# Patient Record
Sex: Male | Born: 1935 | Race: White | Hispanic: No | Marital: Married | Smoking: Never smoker
Health system: Southern US, Community
[De-identification: ages and names within clinical notes are randomized; demographics above are authoritative.]

## PROBLEM LIST (undated history)

## (undated) DIAGNOSIS — R410 Disorientation, unspecified: Secondary | ICD-10-CM

## (undated) DIAGNOSIS — I2699 Other pulmonary embolism without acute cor pulmonale: Secondary | ICD-10-CM

## (undated) DIAGNOSIS — Z923 Personal history of irradiation: Secondary | ICD-10-CM

## (undated) DIAGNOSIS — J90 Pleural effusion, not elsewhere classified: Secondary | ICD-10-CM

## (undated) DIAGNOSIS — I639 Cerebral infarction, unspecified: Secondary | ICD-10-CM

## (undated) DIAGNOSIS — F32A Depression, unspecified: Secondary | ICD-10-CM

## (undated) DIAGNOSIS — H3530 Unspecified macular degeneration: Secondary | ICD-10-CM

## (undated) DIAGNOSIS — F329 Major depressive disorder, single episode, unspecified: Secondary | ICD-10-CM

## (undated) DIAGNOSIS — C801 Malignant (primary) neoplasm, unspecified: Secondary | ICD-10-CM

## (undated) DIAGNOSIS — C3490 Malignant neoplasm of unspecified part of unspecified bronchus or lung: Secondary | ICD-10-CM

## (undated) DIAGNOSIS — M1990 Unspecified osteoarthritis, unspecified site: Secondary | ICD-10-CM

## (undated) DIAGNOSIS — J189 Pneumonia, unspecified organism: Secondary | ICD-10-CM

## (undated) HISTORY — DX: Unspecified osteoarthritis, unspecified site: M19.90

## (undated) HISTORY — DX: Personal history of irradiation: Z92.3

## (undated) HISTORY — PX: OTHER SURGICAL HISTORY: SHX169

## (undated) HISTORY — DX: Depression, unspecified: F32.A

## (undated) HISTORY — PX: HIP SURGERY: SHX245

## (undated) HISTORY — DX: Major depressive disorder, single episode, unspecified: F32.9

## (undated) HISTORY — PX: JOINT REPLACEMENT: SHX530

## (undated) HISTORY — PX: KNEE SURGERY: SHX244

---

## 1988-11-07 DIAGNOSIS — I639 Cerebral infarction, unspecified: Secondary | ICD-10-CM

## 1988-11-07 HISTORY — DX: Cerebral infarction, unspecified: I63.9

## 1988-11-07 HISTORY — PX: REFRACTIVE SURGERY: SHX103

## 1999-09-04 ENCOUNTER — Ambulatory Visit (HOSPITAL_COMMUNITY): Admission: RE | Admit: 1999-09-04 | Discharge: 1999-09-04 | Payer: Self-pay | Admitting: Neurology

## 1999-09-04 ENCOUNTER — Encounter: Payer: Self-pay | Admitting: Neurology

## 2001-11-22 ENCOUNTER — Ambulatory Visit (HOSPITAL_COMMUNITY): Admission: RE | Admit: 2001-11-22 | Discharge: 2001-11-22 | Payer: Self-pay | Admitting: *Deleted

## 2002-04-14 ENCOUNTER — Encounter: Payer: Self-pay | Admitting: Internal Medicine

## 2002-04-14 ENCOUNTER — Inpatient Hospital Stay (HOSPITAL_COMMUNITY): Admission: AD | Admit: 2002-04-14 | Discharge: 2002-04-22 | Payer: Self-pay | Admitting: Internal Medicine

## 2002-04-16 ENCOUNTER — Encounter: Payer: Self-pay | Admitting: Internal Medicine

## 2002-10-20 ENCOUNTER — Encounter: Admission: RE | Admit: 2002-10-20 | Discharge: 2002-10-20 | Payer: Self-pay | Admitting: *Deleted

## 2002-10-20 ENCOUNTER — Encounter: Payer: Self-pay | Admitting: *Deleted

## 2003-04-04 DIAGNOSIS — K5730 Diverticulosis of large intestine without perforation or abscess without bleeding: Secondary | ICD-10-CM | POA: Insufficient documentation

## 2004-03-09 DIAGNOSIS — C3490 Malignant neoplasm of unspecified part of unspecified bronchus or lung: Secondary | ICD-10-CM

## 2004-03-09 HISTORY — DX: Malignant neoplasm of unspecified part of unspecified bronchus or lung: C34.90

## 2004-05-20 ENCOUNTER — Ambulatory Visit: Admission: RE | Admit: 2004-05-20 | Discharge: 2004-05-20 | Payer: Self-pay | Admitting: Orthopedic Surgery

## 2004-05-22 ENCOUNTER — Ambulatory Visit (HOSPITAL_COMMUNITY): Admission: RE | Admit: 2004-05-22 | Discharge: 2004-05-22 | Payer: Self-pay | Admitting: Orthopedic Surgery

## 2004-06-05 ENCOUNTER — Ambulatory Visit (HOSPITAL_COMMUNITY): Admission: RE | Admit: 2004-06-05 | Discharge: 2004-06-05 | Payer: Self-pay | Admitting: Thoracic Surgery

## 2004-06-06 ENCOUNTER — Ambulatory Visit (HOSPITAL_COMMUNITY): Admission: RE | Admit: 2004-06-06 | Discharge: 2004-06-06 | Payer: Self-pay | Admitting: Thoracic Surgery

## 2004-06-10 ENCOUNTER — Ambulatory Visit (HOSPITAL_COMMUNITY): Admission: RE | Admit: 2004-06-10 | Discharge: 2004-06-10 | Payer: Self-pay | Admitting: Thoracic Surgery

## 2004-06-10 ENCOUNTER — Encounter (INDEPENDENT_AMBULATORY_CARE_PROVIDER_SITE_OTHER): Payer: Self-pay | Admitting: *Deleted

## 2004-06-17 ENCOUNTER — Ambulatory Visit (HOSPITAL_COMMUNITY): Admission: RE | Admit: 2004-06-17 | Discharge: 2004-06-17 | Payer: Self-pay | Admitting: Thoracic Surgery

## 2004-06-17 ENCOUNTER — Encounter (INDEPENDENT_AMBULATORY_CARE_PROVIDER_SITE_OTHER): Payer: Self-pay | Admitting: Specialist

## 2004-06-19 ENCOUNTER — Ambulatory Visit: Payer: Self-pay | Admitting: Internal Medicine

## 2004-06-27 ENCOUNTER — Ambulatory Visit: Admission: RE | Admit: 2004-06-27 | Discharge: 2004-07-31 | Payer: Self-pay | Admitting: *Deleted

## 2004-07-10 ENCOUNTER — Emergency Department (HOSPITAL_COMMUNITY): Admission: EM | Admit: 2004-07-10 | Discharge: 2004-07-10 | Payer: Self-pay | Admitting: Emergency Medicine

## 2004-07-13 ENCOUNTER — Inpatient Hospital Stay (HOSPITAL_COMMUNITY): Admission: EM | Admit: 2004-07-13 | Discharge: 2004-07-15 | Payer: Self-pay | Admitting: Emergency Medicine

## 2004-07-13 ENCOUNTER — Ambulatory Visit: Payer: Self-pay | Admitting: Internal Medicine

## 2004-07-14 ENCOUNTER — Ambulatory Visit: Payer: Self-pay | Admitting: Internal Medicine

## 2004-07-14 ENCOUNTER — Encounter: Payer: Self-pay | Admitting: Gastroenterology

## 2004-08-13 ENCOUNTER — Ambulatory Visit: Payer: Self-pay | Admitting: Internal Medicine

## 2004-08-26 ENCOUNTER — Ambulatory Visit: Admission: RE | Admit: 2004-08-26 | Discharge: 2004-08-26 | Payer: Self-pay | Admitting: *Deleted

## 2004-09-12 ENCOUNTER — Ambulatory Visit (HOSPITAL_COMMUNITY): Admission: RE | Admit: 2004-09-12 | Discharge: 2004-09-12 | Payer: Self-pay | Admitting: Internal Medicine

## 2004-09-30 ENCOUNTER — Ambulatory Visit: Payer: Self-pay | Admitting: Internal Medicine

## 2004-11-14 ENCOUNTER — Ambulatory Visit (HOSPITAL_COMMUNITY): Admission: RE | Admit: 2004-11-14 | Discharge: 2004-11-14 | Payer: Self-pay | Admitting: Internal Medicine

## 2004-11-17 ENCOUNTER — Ambulatory Visit: Payer: Self-pay | Admitting: Internal Medicine

## 2005-01-12 ENCOUNTER — Ambulatory Visit: Payer: Self-pay | Admitting: Internal Medicine

## 2005-01-15 ENCOUNTER — Ambulatory Visit (HOSPITAL_COMMUNITY): Admission: RE | Admit: 2005-01-15 | Discharge: 2005-01-15 | Payer: Self-pay | Admitting: Internal Medicine

## 2005-02-17 ENCOUNTER — Encounter: Admission: RE | Admit: 2005-02-17 | Discharge: 2005-02-17 | Payer: Self-pay | Admitting: Orthopedic Surgery

## 2005-04-14 ENCOUNTER — Ambulatory Visit: Payer: Self-pay | Admitting: Internal Medicine

## 2005-04-16 ENCOUNTER — Ambulatory Visit (HOSPITAL_COMMUNITY): Admission: RE | Admit: 2005-04-16 | Discharge: 2005-04-16 | Payer: Self-pay | Admitting: Internal Medicine

## 2005-06-11 ENCOUNTER — Ambulatory Visit (HOSPITAL_COMMUNITY): Admission: RE | Admit: 2005-06-11 | Discharge: 2005-06-11 | Payer: Self-pay | Admitting: Internal Medicine

## 2005-06-16 ENCOUNTER — Ambulatory Visit: Payer: Self-pay | Admitting: Internal Medicine

## 2005-06-16 LAB — CBC WITH DIFFERENTIAL/PLATELET
Basophils Absolute: 0 10*3/uL (ref 0.0–0.1)
Eosinophils Absolute: 0.1 10*3/uL (ref 0.0–0.5)
HGB: 11.9 g/dL — ABNORMAL LOW (ref 13.0–17.1)
LYMPH%: 27 % (ref 14.0–48.0)
MCV: 98.6 fL — ABNORMAL HIGH (ref 81.6–98.0)
MONO%: 11.8 % (ref 0.0–13.0)
NEUT#: 2.5 10*3/uL (ref 1.5–6.5)
NEUT%: 58.5 % (ref 40.0–75.0)
Platelets: 218 10*3/uL (ref 145–400)

## 2005-06-16 LAB — COMPREHENSIVE METABOLIC PANEL
ALT: 9 U/L (ref 0–40)
AST: 19 U/L (ref 0–37)
Albumin: 3.7 g/dL (ref 3.5–5.2)
Alkaline Phosphatase: 69 U/L (ref 39–117)
BUN: 15 mg/dL (ref 6–23)
Creatinine, Ser: 1 mg/dL (ref 0.4–1.5)
Potassium: 4 mEq/L (ref 3.5–5.3)

## 2005-07-22 LAB — CBC WITH DIFFERENTIAL/PLATELET
Basophils Absolute: 0 10*3/uL (ref 0.0–0.1)
EOS%: 1.9 % (ref 0.0–7.0)
Eosinophils Absolute: 0.1 10*3/uL (ref 0.0–0.5)
LYMPH%: 37.7 % (ref 14.0–48.0)
MCH: 33.7 pg — ABNORMAL HIGH (ref 28.0–33.4)
MCV: 98.9 fL — ABNORMAL HIGH (ref 81.6–98.0)
MONO%: 14 % — ABNORMAL HIGH (ref 0.0–13.0)
NEUT#: 1.3 10*3/uL — ABNORMAL LOW (ref 1.5–6.5)
Platelets: 194 10*3/uL (ref 145–400)
RBC: 3.58 10*6/uL — ABNORMAL LOW (ref 4.20–5.71)

## 2005-07-22 LAB — COMPREHENSIVE METABOLIC PANEL
Alkaline Phosphatase: 70 U/L (ref 39–117)
BUN: 15 mg/dL (ref 6–23)
Glucose, Bld: 98 mg/dL (ref 70–99)
Sodium: 141 mEq/L (ref 135–145)
Total Bilirubin: 0.8 mg/dL (ref 0.3–1.2)

## 2005-07-23 ENCOUNTER — Encounter: Admission: RE | Admit: 2005-07-23 | Discharge: 2005-07-23 | Payer: Self-pay | Admitting: Orthopedic Surgery

## 2005-08-17 ENCOUNTER — Ambulatory Visit: Payer: Self-pay | Admitting: Internal Medicine

## 2005-08-19 LAB — COMPREHENSIVE METABOLIC PANEL
AST: 21 U/L (ref 0–37)
Alkaline Phosphatase: 67 U/L (ref 39–117)
Glucose, Bld: 81 mg/dL (ref 70–99)
Sodium: 141 mEq/L (ref 135–145)
Total Bilirubin: 0.8 mg/dL (ref 0.3–1.2)
Total Protein: 6.8 g/dL (ref 6.0–8.3)

## 2005-08-19 LAB — CBC WITH DIFFERENTIAL/PLATELET
BASO%: 0.4 % (ref 0.0–2.0)
EOS%: 2.4 % (ref 0.0–7.0)
Eosinophils Absolute: 0.1 10*3/uL (ref 0.0–0.5)
LYMPH%: 36.5 % (ref 14.0–48.0)
MCH: 34.5 pg — ABNORMAL HIGH (ref 28.0–33.4)
MCHC: 34.3 g/dL (ref 32.0–35.9)
MCV: 100.4 fL — ABNORMAL HIGH (ref 81.6–98.0)
MONO%: 12.9 % (ref 0.0–13.0)
Platelets: 199 10*3/uL (ref 145–400)
RBC: 3.59 10*6/uL — ABNORMAL LOW (ref 4.20–5.71)
RDW: 14.9 % — ABNORMAL HIGH (ref 11.2–14.6)

## 2005-09-15 ENCOUNTER — Ambulatory Visit (HOSPITAL_COMMUNITY): Admission: RE | Admit: 2005-09-15 | Discharge: 2005-09-15 | Payer: Self-pay | Admitting: Internal Medicine

## 2005-09-18 LAB — CBC WITH DIFFERENTIAL/PLATELET
BASO%: 0.6 % (ref 0.0–2.0)
EOS%: 4.8 % (ref 0.0–7.0)
HCT: 32.7 % — ABNORMAL LOW (ref 38.7–49.9)
LYMPH%: 33.6 % (ref 14.0–48.0)
MCH: 34.5 pg — ABNORMAL HIGH (ref 28.0–33.4)
MCHC: 34 g/dL (ref 32.0–35.9)
MONO#: 0.4 10*3/uL (ref 0.1–0.9)
NEUT%: 47.3 % (ref 40.0–75.0)
Platelets: 190 10*3/uL (ref 145–400)

## 2005-09-18 LAB — COMPREHENSIVE METABOLIC PANEL
ALT: <8 U/L (ref 0–40)
Albumin: 3.7 g/dL (ref 3.5–5.2)
Alkaline Phosphatase: 77 U/L (ref 39–117)
Chloride: 108 mEq/L (ref 96–112)
Total Bilirubin: 0.6 mg/dL (ref 0.3–1.2)
Total Protein: 6.1 g/dL (ref 6.0–8.3)

## 2005-10-13 ENCOUNTER — Ambulatory Visit: Payer: Self-pay | Admitting: Internal Medicine

## 2005-10-15 LAB — COMPREHENSIVE METABOLIC PANEL
ALT: 11 U/L (ref 0–40)
AST: 22 U/L (ref 0–37)
Creatinine, Ser: 0.99 mg/dL (ref 0.40–1.50)
Total Bilirubin: 0.8 mg/dL (ref 0.3–1.2)

## 2005-10-15 LAB — CBC WITH DIFFERENTIAL/PLATELET
BASO%: 0.6 % (ref 0.0–2.0)
EOS%: 3.2 % (ref 0.0–7.0)
HCT: 34.1 % — ABNORMAL LOW (ref 38.7–49.9)
LYMPH%: 31.4 % (ref 14.0–48.0)
MCH: 34.5 pg — ABNORMAL HIGH (ref 28.0–33.4)
MCHC: 34 g/dL (ref 32.0–35.9)
NEUT%: 53.9 % (ref 40.0–75.0)
Platelets: 199 10*3/uL (ref 145–400)

## 2005-10-16 ENCOUNTER — Ambulatory Visit: Payer: Self-pay | Admitting: Physical Medicine & Rehabilitation

## 2005-10-19 ENCOUNTER — Inpatient Hospital Stay (HOSPITAL_COMMUNITY): Admission: RE | Admit: 2005-10-19 | Discharge: 2005-10-23 | Payer: Self-pay | Admitting: Orthopedic Surgery

## 2005-10-23 ENCOUNTER — Inpatient Hospital Stay (HOSPITAL_COMMUNITY)
Admission: RE | Admit: 2005-10-23 | Discharge: 2005-11-05 | Payer: Self-pay | Admitting: Physical Medicine & Rehabilitation

## 2005-11-20 ENCOUNTER — Encounter: Admission: RE | Admit: 2005-11-20 | Discharge: 2005-11-20 | Payer: Self-pay | Admitting: Orthopedic Surgery

## 2005-11-23 ENCOUNTER — Encounter: Admission: RE | Admit: 2005-11-23 | Discharge: 2005-12-29 | Payer: Self-pay | Admitting: Orthopedic Surgery

## 2005-12-07 ENCOUNTER — Ambulatory Visit: Payer: Self-pay | Admitting: Internal Medicine

## 2005-12-10 ENCOUNTER — Ambulatory Visit (HOSPITAL_COMMUNITY): Admission: RE | Admit: 2005-12-10 | Discharge: 2005-12-10 | Payer: Self-pay | Admitting: Internal Medicine

## 2005-12-10 ENCOUNTER — Encounter: Payer: Self-pay | Admitting: Vascular Surgery

## 2005-12-18 ENCOUNTER — Inpatient Hospital Stay (HOSPITAL_COMMUNITY): Admission: EM | Admit: 2005-12-18 | Discharge: 2005-12-21 | Payer: Self-pay | Admitting: *Deleted

## 2005-12-30 ENCOUNTER — Encounter: Admission: RE | Admit: 2005-12-30 | Discharge: 2006-01-21 | Payer: Self-pay | Admitting: Orthopedic Surgery

## 2006-01-11 ENCOUNTER — Ambulatory Visit (HOSPITAL_COMMUNITY): Admission: RE | Admit: 2006-01-11 | Discharge: 2006-01-11 | Payer: Self-pay | Admitting: Internal Medicine

## 2006-01-11 LAB — CBC WITH DIFFERENTIAL/PLATELET
BASO%: 0.4 % (ref 0.0–2.0)
Basophils Absolute: 0 10*3/uL (ref 0.0–0.1)
EOS%: 2.4 % (ref 0.0–7.0)
Eosinophils Absolute: 0.1 10*3/uL (ref 0.0–0.5)
HCT: 35.3 % — ABNORMAL LOW (ref 38.7–49.9)
HGB: 11.8 g/dL — ABNORMAL LOW (ref 13.0–17.1)
LYMPH%: 31.2 % (ref 14.0–48.0)
MCH: 33.2 pg (ref 28.0–33.4)
MCHC: 33.5 g/dL (ref 32.0–35.9)
MCV: 99.2 fL — ABNORMAL HIGH (ref 81.6–98.0)
MONO#: 0.4 10*3/uL (ref 0.1–0.9)
MONO%: 9.1 % (ref 0.0–13.0)
NEUT#: 2.6 10*3/uL (ref 1.5–6.5)
NEUT%: 56.9 % (ref 40.0–75.0)
Platelets: 264 10*3/uL (ref 145–400)
RBC: 3.56 10*6/uL — ABNORMAL LOW (ref 4.20–5.71)
RDW: 14.4 % (ref 11.2–14.6)
WBC: 4.5 10*3/uL (ref 4.0–10.0)
lymph#: 1.4 10*3/uL (ref 0.9–3.3)

## 2006-01-11 LAB — COMPREHENSIVE METABOLIC PANEL
ALT: <8 U/L (ref 0–53)
AST: 21 U/L (ref 0–37)
Albumin: 3.6 g/dL (ref 3.5–5.2)
Alkaline Phosphatase: 65 U/L (ref 39–117)
BUN: 13 mg/dL (ref 6–23)
CO2: 26 mEq/L (ref 19–32)
Calcium: 8.6 mg/dL (ref 8.4–10.5)
Chloride: 103 mEq/L (ref 96–112)
Creatinine, Ser: 0.87 mg/dL (ref 0.40–1.50)
Glucose, Bld: 109 mg/dL — ABNORMAL HIGH (ref 70–99)
Potassium: 4.2 mEq/L (ref 3.5–5.3)
Sodium: 139 mEq/L (ref 135–145)
Total Bilirubin: 0.6 mg/dL (ref 0.3–1.2)
Total Protein: 6.5 g/dL (ref 6.0–8.3)

## 2006-01-29 ENCOUNTER — Inpatient Hospital Stay (HOSPITAL_COMMUNITY): Admission: RE | Admit: 2006-01-29 | Discharge: 2006-02-02 | Payer: Self-pay | Admitting: Orthopedic Surgery

## 2006-02-02 ENCOUNTER — Ambulatory Visit: Payer: Self-pay | Admitting: Physical Medicine & Rehabilitation

## 2006-02-02 ENCOUNTER — Inpatient Hospital Stay (HOSPITAL_COMMUNITY)
Admission: RE | Admit: 2006-02-02 | Discharge: 2006-02-09 | Payer: Self-pay | Admitting: Physical Medicine & Rehabilitation

## 2006-02-11 ENCOUNTER — Ambulatory Visit: Payer: Self-pay | Admitting: Internal Medicine

## 2006-02-15 LAB — CBC WITH DIFFERENTIAL/PLATELET
BASO%: 0.5 % (ref 0.0–2.0)
LYMPH%: 27.9 % (ref 14.0–48.0)
MCHC: 33 g/dL (ref 32.0–35.9)
MCV: 98 fL (ref 81.6–98.0)
MONO%: 11.8 % (ref 0.0–13.0)
Platelets: 375 10*3/uL (ref 145–400)
RBC: 3.02 10*6/uL — ABNORMAL LOW (ref 4.20–5.71)
WBC: 4.1 10*3/uL (ref 4.0–10.0)

## 2006-02-15 LAB — COMPREHENSIVE METABOLIC PANEL
ALT: 10 U/L (ref 0–53)
AST: 22 U/L (ref 0–37)
Alkaline Phosphatase: 74 U/L (ref 39–117)
Sodium: 142 mEq/L (ref 135–145)
Total Bilirubin: 0.4 mg/dL (ref 0.3–1.2)
Total Protein: 6.2 g/dL (ref 6.0–8.3)

## 2006-03-23 ENCOUNTER — Ambulatory Visit: Payer: Self-pay | Admitting: Internal Medicine

## 2006-03-23 LAB — CBC WITH DIFFERENTIAL/PLATELET
Basophils Absolute: 0 10*3/uL (ref 0.0–0.1)
EOS%: 4.9 % (ref 0.0–7.0)
HCT: 34 % — ABNORMAL LOW (ref 38.7–49.9)
HGB: 11.3 g/dL — ABNORMAL LOW (ref 13.0–17.1)
MCH: 32.8 pg (ref 28.0–33.4)
MCV: 98.6 fL — ABNORMAL HIGH (ref 81.6–98.0)
MONO%: 11.1 % (ref 0.0–13.0)
NEUT%: 43.6 % (ref 40.0–75.0)

## 2006-03-23 LAB — COMPREHENSIVE METABOLIC PANEL
AST: 21 U/L (ref 0–37)
Alkaline Phosphatase: 86 U/L (ref 39–117)
BUN: 22 mg/dL (ref 6–23)
Creatinine, Ser: 1.02 mg/dL (ref 0.40–1.50)
Total Bilirubin: 0.5 mg/dL (ref 0.3–1.2)

## 2006-04-16 ENCOUNTER — Ambulatory Visit (HOSPITAL_COMMUNITY): Admission: RE | Admit: 2006-04-16 | Discharge: 2006-04-16 | Payer: Self-pay | Admitting: Internal Medicine

## 2006-04-20 LAB — CBC WITH DIFFERENTIAL/PLATELET
Basophils Absolute: 0 10*3/uL (ref 0.0–0.1)
EOS%: 3.8 % (ref 0.0–7.0)
HCT: 33.6 % — ABNORMAL LOW (ref 38.7–49.9)
HGB: 11.7 g/dL — ABNORMAL LOW (ref 13.0–17.1)
MCH: 33.3 pg (ref 28.0–33.4)
MCV: 95.9 fL (ref 81.6–98.0)
NEUT%: 46.5 % (ref 40.0–75.0)
lymph#: 1.3 10*3/uL (ref 0.9–3.3)

## 2006-04-20 LAB — COMPREHENSIVE METABOLIC PANEL
AST: 23 U/L (ref 0–37)
BUN: 19 mg/dL (ref 6–23)
Calcium: 9.2 mg/dL (ref 8.4–10.5)
Chloride: 100 mEq/L (ref 96–112)
Creatinine, Ser: 1.18 mg/dL (ref 0.40–1.50)

## 2006-05-13 ENCOUNTER — Ambulatory Visit: Payer: Self-pay | Admitting: Internal Medicine

## 2006-05-18 LAB — COMPREHENSIVE METABOLIC PANEL
ALT: <8 U/L (ref 0–53)
AST: 27 U/L (ref 0–37)
Albumin: 3.9 g/dL (ref 3.5–5.2)
Alkaline Phosphatase: 90 U/L (ref 39–117)
BUN: 22 mg/dL (ref 6–23)
CO2: 32 mEq/L (ref 19–32)
Calcium: 9.2 mg/dL (ref 8.4–10.5)
Chloride: 100 mEq/L (ref 96–112)
Creatinine, Ser: 1.13 mg/dL (ref 0.40–1.50)
Glucose, Bld: 82 mg/dL (ref 70–99)
Potassium: 3.7 mEq/L (ref 3.5–5.3)
Sodium: 140 mEq/L (ref 135–145)
Total Bilirubin: 0.8 mg/dL (ref 0.3–1.2)
Total Protein: 7.5 g/dL (ref 6.0–8.3)

## 2006-05-18 LAB — CBC WITH DIFFERENTIAL/PLATELET
Basophils Absolute: 0 10*3/uL (ref 0.0–0.1)
EOS%: 2.5 % (ref 0.0–7.0)
HGB: 12.1 g/dL — ABNORMAL LOW (ref 13.0–17.1)
LYMPH%: 28.7 % (ref 14.0–48.0)
MCH: 32.3 pg (ref 28.0–33.4)
MCV: 95.3 fL (ref 81.6–98.0)
MONO%: 14.3 % — ABNORMAL HIGH (ref 0.0–13.0)
RBC: 3.76 10*6/uL — ABNORMAL LOW (ref 4.20–5.71)
RDW: 14.8 % — ABNORMAL HIGH (ref 11.2–14.6)

## 2006-06-15 LAB — COMPREHENSIVE METABOLIC PANEL
AST: 24 U/L (ref 0–37)
Alkaline Phosphatase: 93 U/L (ref 39–117)
BUN: 24 mg/dL — ABNORMAL HIGH (ref 6–23)
Creatinine, Ser: 1.09 mg/dL (ref 0.40–1.50)
Glucose, Bld: 89 mg/dL (ref 70–99)

## 2006-06-15 LAB — CBC WITH DIFFERENTIAL/PLATELET
Basophils Absolute: 0 10*3/uL (ref 0.0–0.1)
EOS%: 2.2 % (ref 0.0–7.0)
Eosinophils Absolute: 0.1 10*3/uL (ref 0.0–0.5)
HCT: 35.1 % — ABNORMAL LOW (ref 38.7–49.9)
HGB: 12.4 g/dL — ABNORMAL LOW (ref 13.0–17.1)
LYMPH%: 34 % (ref 14.0–48.0)
MCH: 33.7 pg — ABNORMAL HIGH (ref 28.0–33.4)
MCV: 95.4 fL (ref 81.6–98.0)
MONO%: 12.6 % (ref 0.0–13.0)
NEUT%: 50.3 % (ref 40.0–75.0)
Platelets: 217 10*3/uL (ref 145–400)

## 2006-07-09 ENCOUNTER — Ambulatory Visit (HOSPITAL_COMMUNITY): Admission: RE | Admit: 2006-07-09 | Discharge: 2006-07-09 | Payer: Self-pay | Admitting: Internal Medicine

## 2006-07-09 ENCOUNTER — Ambulatory Visit: Payer: Self-pay | Admitting: Internal Medicine

## 2006-07-13 LAB — COMPREHENSIVE METABOLIC PANEL
AST: 29 U/L (ref 0–37)
Albumin: 4.2 g/dL (ref 3.5–5.2)
BUN: 23 mg/dL (ref 6–23)
Calcium: 9.1 mg/dL (ref 8.4–10.5)
Chloride: 100 mEq/L (ref 96–112)
Creatinine, Ser: 1.22 mg/dL (ref 0.40–1.50)
Glucose, Bld: 79 mg/dL (ref 70–99)
Potassium: 3.8 mEq/L (ref 3.5–5.3)

## 2006-07-13 LAB — CBC WITH DIFFERENTIAL/PLATELET
Basophils Absolute: 0 10*3/uL (ref 0.0–0.1)
EOS%: 1.1 % (ref 0.0–7.0)
Eosinophils Absolute: 0 10*3/uL (ref 0.0–0.5)
HCT: 35.3 % — ABNORMAL LOW (ref 38.7–49.9)
HGB: 12.3 g/dL — ABNORMAL LOW (ref 13.0–17.1)
MCH: 34.2 pg — ABNORMAL HIGH (ref 28.0–33.4)
MCV: 98 fL (ref 81.6–98.0)
MONO%: 14 % — ABNORMAL HIGH (ref 0.0–13.0)
NEUT#: 1.7 10*3/uL (ref 1.5–6.5)
NEUT%: 52.4 % (ref 40.0–75.0)
lymph#: 1 10*3/uL (ref 0.9–3.3)

## 2006-09-09 ENCOUNTER — Ambulatory Visit: Payer: Self-pay | Admitting: Internal Medicine

## 2006-09-14 LAB — CBC WITH DIFFERENTIAL/PLATELET
BASO%: 0.5 % (ref 0.0–2.0)
EOS%: 1 % (ref 0.0–7.0)
HCT: 34.3 % — ABNORMAL LOW (ref 38.7–49.9)
HGB: 11.9 g/dL — ABNORMAL LOW (ref 13.0–17.1)
MCH: 34.3 pg — ABNORMAL HIGH (ref 28.0–33.4)
MONO%: 12.4 % (ref 0.0–13.0)
Platelets: 209 10*3/uL (ref 145–400)
RBC: 3.48 10*6/uL — ABNORMAL LOW (ref 4.20–5.71)
RDW: 13.6 % (ref 11.2–14.6)
WBC: 4.5 10*3/uL (ref 4.0–10.0)

## 2006-09-14 LAB — COMPREHENSIVE METABOLIC PANEL
AST: 20 U/L (ref 0–37)
Albumin: 3.9 g/dL (ref 3.5–5.2)
Alkaline Phosphatase: 80 U/L (ref 39–117)
Potassium: 3.8 mEq/L (ref 3.5–5.3)
Sodium: 141 mEq/L (ref 135–145)
Total Protein: 7.4 g/dL (ref 6.0–8.3)

## 2006-09-17 ENCOUNTER — Ambulatory Visit (HOSPITAL_COMMUNITY): Admission: RE | Admit: 2006-09-17 | Discharge: 2006-09-17 | Payer: Self-pay | Admitting: Internal Medicine

## 2006-10-18 ENCOUNTER — Ambulatory Visit: Payer: Self-pay | Admitting: Gastroenterology

## 2006-10-19 ENCOUNTER — Ambulatory Visit: Payer: Self-pay | Admitting: Gastroenterology

## 2006-12-13 ENCOUNTER — Ambulatory Visit: Payer: Self-pay | Admitting: Internal Medicine

## 2006-12-15 ENCOUNTER — Ambulatory Visit (HOSPITAL_COMMUNITY): Admission: RE | Admit: 2006-12-15 | Discharge: 2006-12-15 | Payer: Self-pay | Admitting: Internal Medicine

## 2006-12-15 LAB — COMPREHENSIVE METABOLIC PANEL
AST: 22 U/L (ref 0–37)
Albumin: 3.5 g/dL (ref 3.5–5.2)
Alkaline Phosphatase: 74 U/L (ref 39–117)
BUN: 15 mg/dL (ref 6–23)
Potassium: 3.7 mEq/L (ref 3.5–5.3)
Sodium: 140 mEq/L (ref 135–145)
Total Bilirubin: 0.5 mg/dL (ref 0.3–1.2)

## 2006-12-15 LAB — CBC WITH DIFFERENTIAL/PLATELET
Basophils Absolute: 0 10*3/uL (ref 0.0–0.1)
EOS%: 1.3 % (ref 0.0–7.0)
LYMPH%: 30.8 % (ref 14.0–48.0)
MCH: 34.3 pg — ABNORMAL HIGH (ref 28.0–33.4)
MCV: 96.2 fL (ref 81.6–98.0)
MONO%: 15 % — ABNORMAL HIGH (ref 0.0–13.0)
RBC: 3.45 10*6/uL — ABNORMAL LOW (ref 4.20–5.71)
RDW: 13.5 % (ref 11.2–14.6)

## 2007-04-12 ENCOUNTER — Ambulatory Visit: Payer: Self-pay | Admitting: Internal Medicine

## 2007-04-14 ENCOUNTER — Ambulatory Visit (HOSPITAL_COMMUNITY): Admission: RE | Admit: 2007-04-14 | Discharge: 2007-04-14 | Payer: Self-pay | Admitting: Internal Medicine

## 2007-04-14 LAB — CBC WITH DIFFERENTIAL/PLATELET
BASO%: 0.5 % (ref 0.0–2.0)
LYMPH%: 37.2 % (ref 14.0–48.0)
MCHC: 35.3 g/dL (ref 32.0–35.9)
MONO#: 0.5 10*3/uL (ref 0.1–0.9)
Platelets: 198 10*3/uL (ref 145–400)
RBC: 3.7 10*6/uL — ABNORMAL LOW (ref 4.20–5.71)
RDW: 13.5 % (ref 11.2–14.6)
WBC: 4.2 10*3/uL (ref 4.0–10.0)
lymph#: 1.5 10*3/uL (ref 0.9–3.3)

## 2007-04-14 LAB — COMPREHENSIVE METABOLIC PANEL
ALT: <8 U/L (ref 0–53)
Albumin: 3.6 g/dL (ref 3.5–5.2)
Alkaline Phosphatase: 75 U/L (ref 39–117)
CO2: 29 mEq/L (ref 19–32)
Potassium: 3.8 mEq/L (ref 3.5–5.3)
Sodium: 139 mEq/L (ref 135–145)
Total Bilirubin: 0.8 mg/dL (ref 0.3–1.2)
Total Protein: 7.3 g/dL (ref 6.0–8.3)

## 2007-06-11 DIAGNOSIS — C3490 Malignant neoplasm of unspecified part of unspecified bronchus or lung: Secondary | ICD-10-CM | POA: Insufficient documentation

## 2007-06-11 DIAGNOSIS — I6350 Cerebral infarction due to unspecified occlusion or stenosis of unspecified cerebral artery: Secondary | ICD-10-CM | POA: Insufficient documentation

## 2007-06-11 DIAGNOSIS — M129 Arthropathy, unspecified: Secondary | ICD-10-CM | POA: Insufficient documentation

## 2007-06-11 DIAGNOSIS — G20 Parkinson's disease: Secondary | ICD-10-CM

## 2007-08-29 ENCOUNTER — Ambulatory Visit: Payer: Self-pay | Admitting: Internal Medicine

## 2007-08-31 LAB — COMPREHENSIVE METABOLIC PANEL
ALT: <8 U/L (ref 0–53)
Albumin: 3.8 g/dL (ref 3.5–5.2)
CO2: 25 mEq/L (ref 19–32)
Calcium: 9 mg/dL (ref 8.4–10.5)
Chloride: 106 mEq/L (ref 96–112)
Glucose, Bld: 67 mg/dL — ABNORMAL LOW (ref 70–99)
Potassium: 3.9 mEq/L (ref 3.5–5.3)
Sodium: 141 mEq/L (ref 135–145)
Total Protein: 7 g/dL (ref 6.0–8.3)

## 2007-08-31 LAB — CBC WITH DIFFERENTIAL/PLATELET
BASO%: 0.7 % (ref 0.0–2.0)
Eosinophils Absolute: 0.1 10*3/uL (ref 0.0–0.5)
MCHC: 35 g/dL (ref 32.0–35.9)
MONO#: 0.4 10*3/uL (ref 0.1–0.9)
NEUT#: 1.6 10*3/uL (ref 1.5–6.5)
RBC: 3.72 10*6/uL — ABNORMAL LOW (ref 4.20–5.71)
WBC: 3.6 10*3/uL — ABNORMAL LOW (ref 4.0–10.0)
lymph#: 1.4 10*3/uL (ref 0.9–3.3)

## 2007-09-01 ENCOUNTER — Ambulatory Visit (HOSPITAL_COMMUNITY): Admission: RE | Admit: 2007-09-01 | Discharge: 2007-09-01 | Payer: Self-pay | Admitting: Internal Medicine

## 2008-02-21 ENCOUNTER — Ambulatory Visit: Payer: Self-pay | Admitting: Internal Medicine

## 2008-02-23 LAB — CBC WITH DIFFERENTIAL/PLATELET
Basophils Absolute: 0 10*3/uL (ref 0.0–0.1)
Eosinophils Absolute: 0.1 10*3/uL (ref 0.0–0.5)
HCT: 37.8 % — ABNORMAL LOW (ref 38.7–49.9)
HGB: 13 g/dL (ref 13.0–17.1)
LYMPH%: 31.8 % (ref 14.0–48.0)
MCV: 98.6 fL — ABNORMAL HIGH (ref 81.6–98.0)
MONO#: 0.4 10*3/uL (ref 0.1–0.9)
NEUT#: 2.4 10*3/uL (ref 1.5–6.5)
NEUT%: 56.7 % (ref 40.0–75.0)
Platelets: 224 10*3/uL (ref 145–400)
WBC: 4.2 10*3/uL (ref 4.0–10.0)

## 2008-02-24 ENCOUNTER — Ambulatory Visit (HOSPITAL_COMMUNITY): Admission: RE | Admit: 2008-02-24 | Discharge: 2008-02-24 | Payer: Self-pay | Admitting: Internal Medicine

## 2008-02-24 LAB — COMPREHENSIVE METABOLIC PANEL
BUN: 18 mg/dL (ref 6–23)
CO2: 30 mEq/L (ref 19–32)
Creatinine, Ser: 1.14 mg/dL (ref 0.40–1.50)
Glucose, Bld: 104 mg/dL — ABNORMAL HIGH (ref 70–99)
Sodium: 140 mEq/L (ref 135–145)
Total Bilirubin: 0.6 mg/dL (ref 0.3–1.2)
Total Protein: 7.8 g/dL (ref 6.0–8.3)

## 2008-03-06 ENCOUNTER — Ambulatory Visit (HOSPITAL_COMMUNITY): Admission: RE | Admit: 2008-03-06 | Discharge: 2008-03-06 | Payer: Self-pay | Admitting: Internal Medicine

## 2008-03-22 ENCOUNTER — Ambulatory Visit (HOSPITAL_COMMUNITY): Admission: RE | Admit: 2008-03-22 | Discharge: 2008-03-22 | Payer: Self-pay | Admitting: Internal Medicine

## 2008-05-21 ENCOUNTER — Ambulatory Visit (HOSPITAL_COMMUNITY): Admission: RE | Admit: 2008-05-21 | Discharge: 2008-05-21 | Payer: Self-pay | Admitting: Internal Medicine

## 2008-08-13 ENCOUNTER — Ambulatory Visit: Admission: RE | Admit: 2008-08-13 | Discharge: 2008-10-24 | Payer: Self-pay | Admitting: Radiation Oncology

## 2008-08-14 ENCOUNTER — Ambulatory Visit: Payer: Self-pay | Admitting: Internal Medicine

## 2008-08-16 ENCOUNTER — Ambulatory Visit (HOSPITAL_COMMUNITY): Admission: RE | Admit: 2008-08-16 | Discharge: 2008-08-16 | Payer: Self-pay | Admitting: Internal Medicine

## 2008-08-16 LAB — CBC WITH DIFFERENTIAL/PLATELET
Eosinophils Absolute: 0.1 10*3/uL (ref 0.0–0.5)
HCT: 35.2 % — ABNORMAL LOW (ref 38.4–49.9)
LYMPH%: 26.5 % (ref 14.0–49.0)
MCHC: 33.8 g/dL (ref 32.0–36.0)
MCV: 95.9 fL (ref 79.3–98.0)
MONO#: 0.5 10*3/uL (ref 0.1–0.9)
MONO%: 11.1 % (ref 0.0–14.0)
NEUT#: 2.6 10*3/uL (ref 1.5–6.5)
NEUT%: 59.2 % (ref 39.0–75.0)
Platelets: 166 10*3/uL (ref 140–400)
WBC: 4.3 10*3/uL (ref 4.0–10.3)

## 2008-08-16 LAB — COMPREHENSIVE METABOLIC PANEL
Alkaline Phosphatase: 77 U/L (ref 39–117)
CO2: 28 mEq/L (ref 19–32)
Creatinine, Ser: 0.99 mg/dL (ref 0.40–1.50)
Glucose, Bld: 92 mg/dL (ref 70–99)
Total Bilirubin: 0.6 mg/dL (ref 0.3–1.2)

## 2009-02-02 IMAGING — CT CT CHEST W/ CM
2 of 3 series · 15 of 36 positions shown, 18 images · IV contrast (omnipaque)
Comparison: 09/17/06.

CLINICAL DATA: Followup lung carcinoma.  Shortness of breath and chest pain.
 CHEST CT WITH CONTRAST:
TECHNIQUE: Multidetector CT imaging of the chest was performed following the standard protocol during bolus administration of intravenous contrast.
 Contrast:  80 cc Omnipaque 300.

[Series 2: chest routine 5.0 b40f · axial · 0.69mm/px · z∈[-311,-36]mm · 12 of 65 slices shown, 15 images]
[im 5/65  mediastinal]
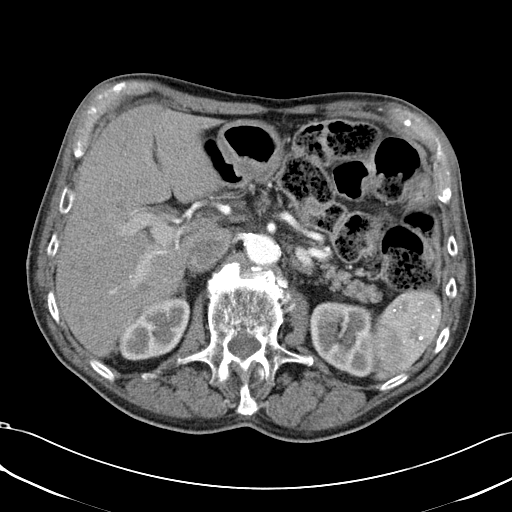
[im 5/65  lung]
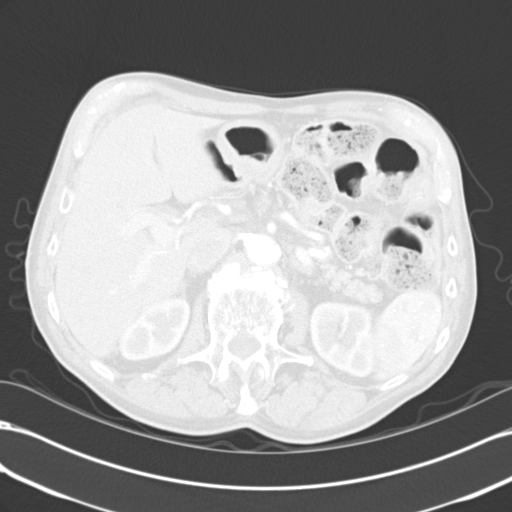
[im 10/65  lung]
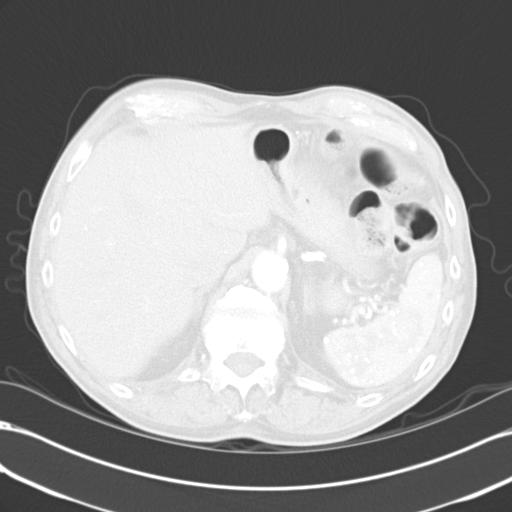
[im 15/65  lung]
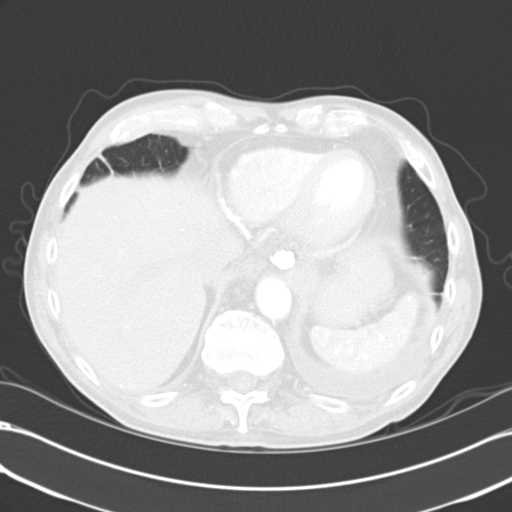
[im 19/65  lung]
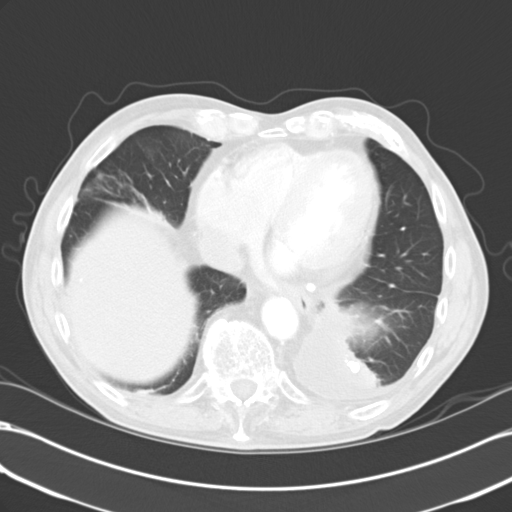
[im 24/65  mediastinal]
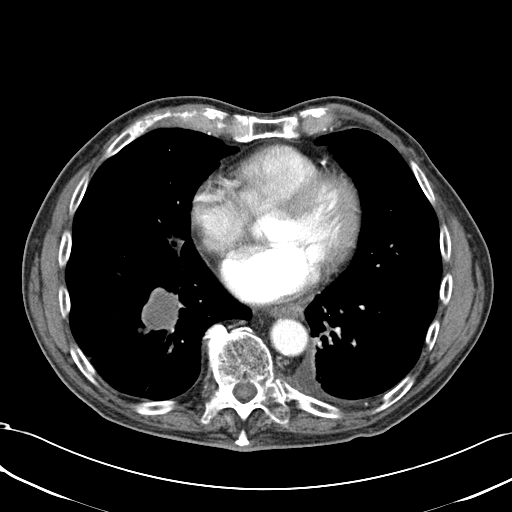
[im 24/65  lung]
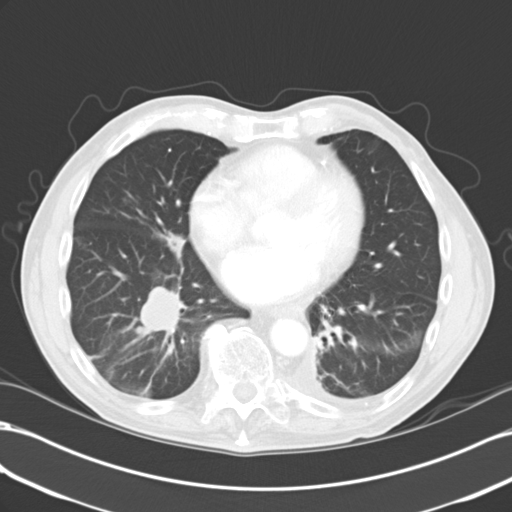
[im 29/65  lung]
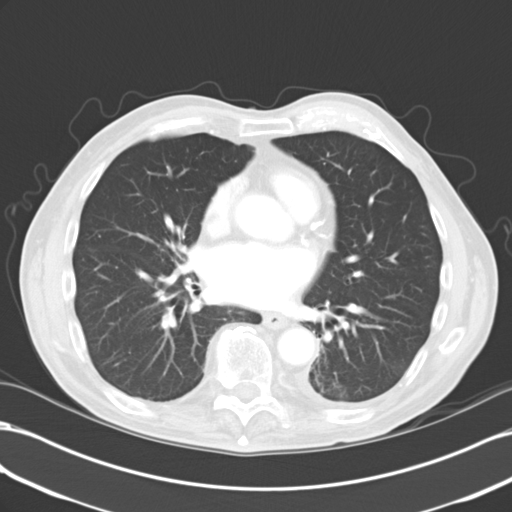
[im 36/65  lung]
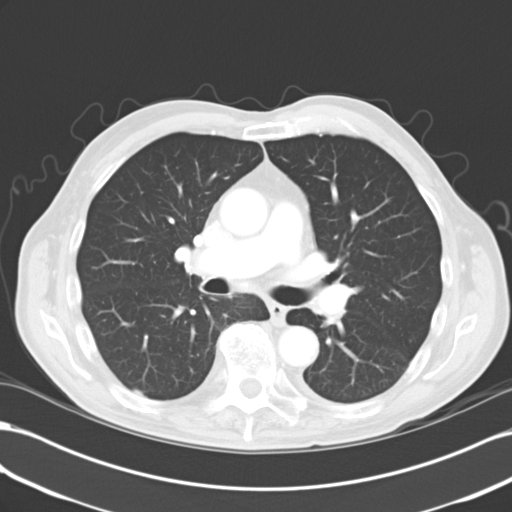
[im 41/65  lung]
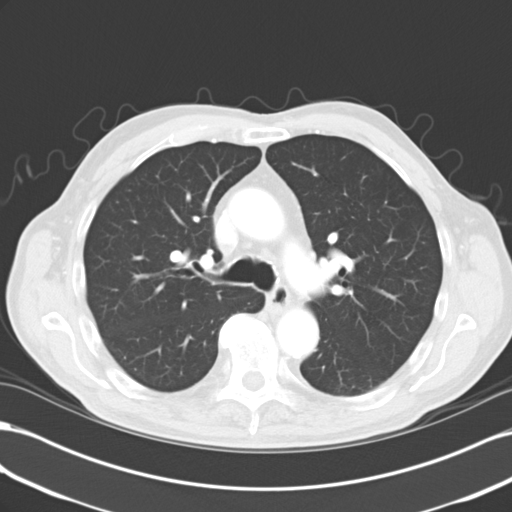
[im 46/65  mediastinal]
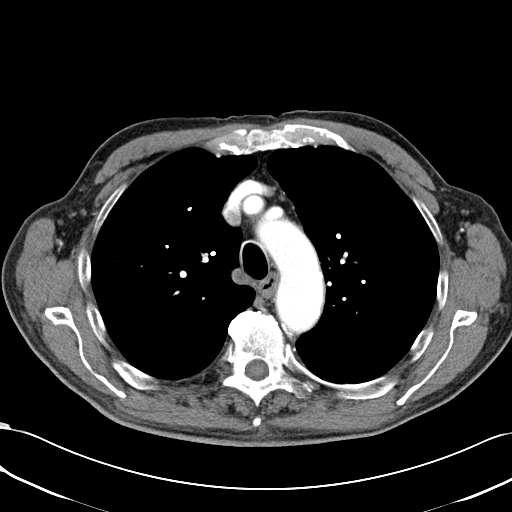
[im 46/65  lung]
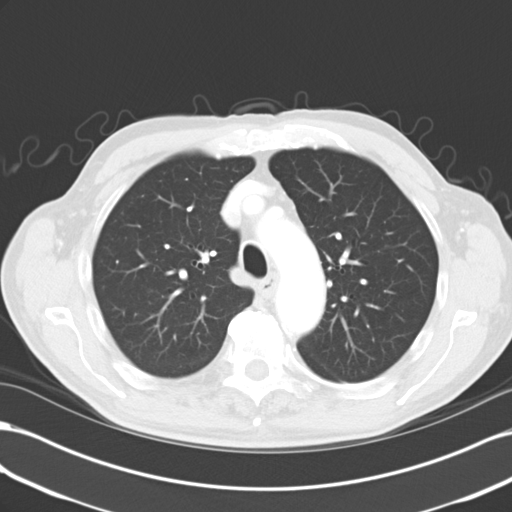
[im 50/65  lung]
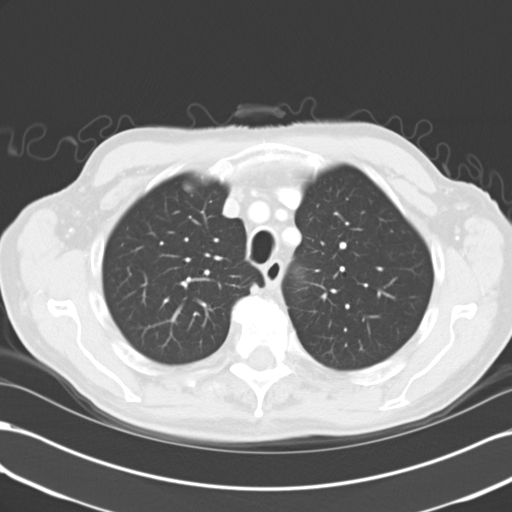
[im 55/65  lung]
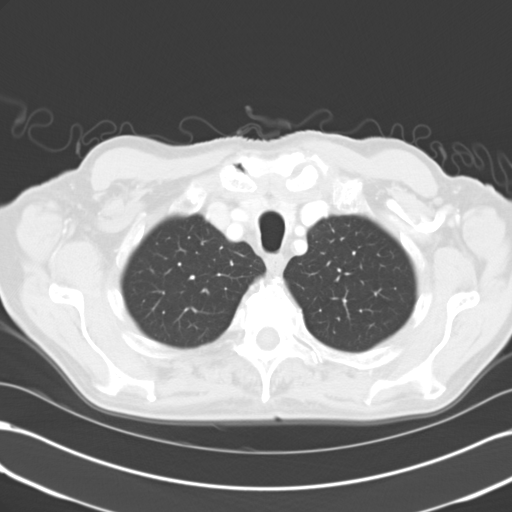
[im 60/65  lung]
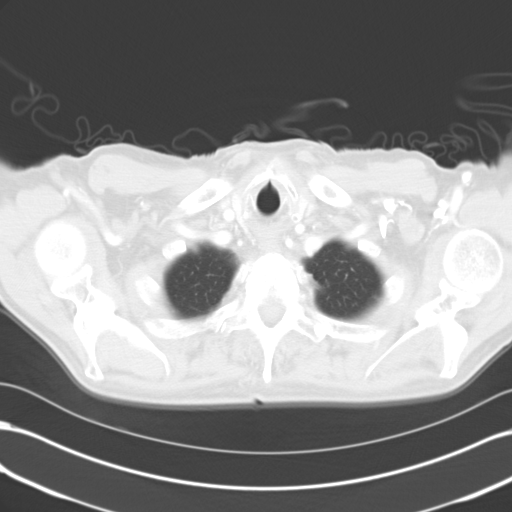

[Series 602: <mpr thick range> · coronal · 0.69mm/px · 3 of 71 slices shown]
[im 15/71  lung]
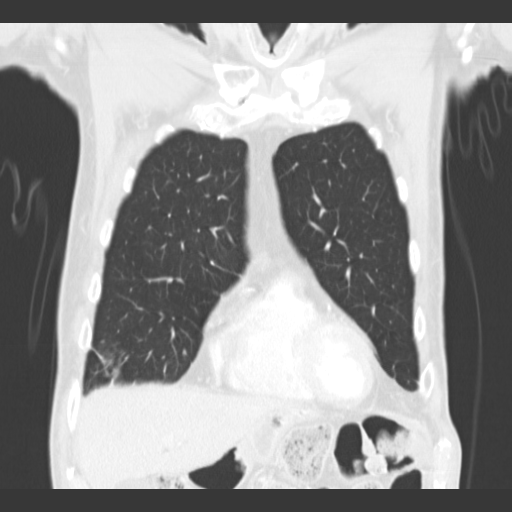
[im 29/71  lung]
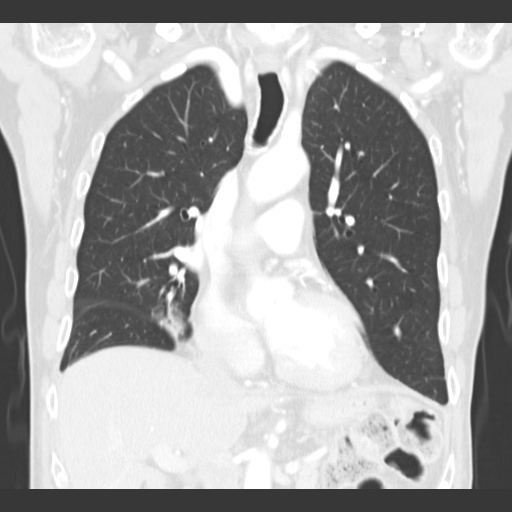
[im 43/71  lung]
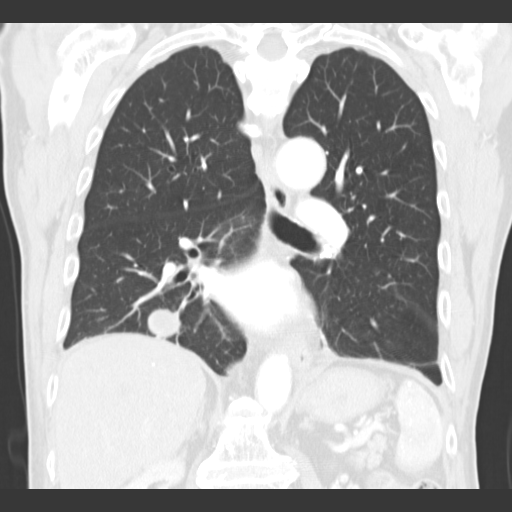

[15 of 36 positions shown; findings below may reference images not displayed]

FINDINGS: Mass in the central right lower lobe is stable measuring approximately 3.1 x 3.0 cm.  No new or enlarging pulmonary nodules or masses are identified.  Scarring in both lung bases is stable, as well as mild left pleural thickening versus small effusion.  
 There is no evidence of hilar or mediastinal masses and no adenopathy is seen elsewhere within the thorax.
IMPRESSION: 1. Stable right lower lobe mass.  No new or progressive disease within the thorax.  
 2. Stable bibasilar scarring and small left pleural effusion versus pleural thickening.

## 2009-02-11 ENCOUNTER — Ambulatory Visit: Payer: Self-pay | Admitting: Internal Medicine

## 2009-02-13 ENCOUNTER — Ambulatory Visit (HOSPITAL_COMMUNITY): Admission: RE | Admit: 2009-02-13 | Discharge: 2009-02-13 | Payer: Self-pay | Admitting: Internal Medicine

## 2009-02-13 LAB — COMPREHENSIVE METABOLIC PANEL
ALT: <8 U/L (ref 0–53)
Alkaline Phosphatase: 65 U/L (ref 39–117)
CO2: 28 mEq/L (ref 19–32)
Creatinine, Ser: 1.07 mg/dL (ref 0.40–1.50)
Sodium: 140 mEq/L (ref 135–145)
Total Bilirubin: 0.7 mg/dL (ref 0.3–1.2)
Total Protein: 7.8 g/dL (ref 6.0–8.3)

## 2009-02-13 LAB — CBC WITH DIFFERENTIAL/PLATELET
BASO%: 0.5 % (ref 0.0–2.0)
EOS%: 1.7 % (ref 0.0–7.0)
HCT: 38.2 % — ABNORMAL LOW (ref 38.4–49.9)
MCH: 34.2 pg — ABNORMAL HIGH (ref 27.2–33.4)
MCHC: 34.3 g/dL (ref 32.0–36.0)
NEUT%: 56.1 % (ref 39.0–75.0)
lymph#: 1.4 10*3/uL (ref 0.9–3.3)

## 2009-02-26 ENCOUNTER — Emergency Department (HOSPITAL_BASED_OUTPATIENT_CLINIC_OR_DEPARTMENT_OTHER): Admission: EM | Admit: 2009-02-26 | Discharge: 2009-02-26 | Payer: Self-pay | Admitting: Emergency Medicine

## 2009-02-26 ENCOUNTER — Ambulatory Visit: Payer: Self-pay | Admitting: Diagnostic Radiology

## 2009-08-16 ENCOUNTER — Ambulatory Visit: Payer: Self-pay | Admitting: Internal Medicine

## 2009-11-19 ENCOUNTER — Ambulatory Visit: Payer: Self-pay | Admitting: Internal Medicine

## 2009-11-19 LAB — COMPREHENSIVE METABOLIC PANEL
ALT: <8 U/L (ref 0–53)
AST: 12 U/L (ref 0–37)
Alkaline Phosphatase: 167 U/L — ABNORMAL HIGH (ref 39–117)
Creatinine, Ser: 0.82 mg/dL (ref 0.40–1.50)
Sodium: 139 mEq/L (ref 135–145)
Total Bilirubin: 0.4 mg/dL (ref 0.3–1.2)
Total Protein: 6.9 g/dL (ref 6.0–8.3)

## 2009-11-19 LAB — CBC WITH DIFFERENTIAL/PLATELET
BASO%: 0.4 % (ref 0.0–2.0)
EOS%: 1.9 % (ref 0.0–7.0)
LYMPH%: 21.5 % (ref 14.0–49.0)
MCH: 33.4 pg (ref 27.2–33.4)
MCHC: 33.9 g/dL (ref 32.0–36.0)
MCV: 98.5 fL — ABNORMAL HIGH (ref 79.3–98.0)
MONO#: 0.5 10*3/uL (ref 0.1–0.9)
MONO%: 10.6 % (ref 0.0–14.0)
Platelets: 263 10*3/uL (ref 140–400)
RBC: 3.34 10*6/uL — ABNORMAL LOW (ref 4.20–5.82)
WBC: 4.8 10*3/uL (ref 4.0–10.3)

## 2009-11-28 ENCOUNTER — Ambulatory Visit (HOSPITAL_COMMUNITY): Admission: RE | Admit: 2009-11-28 | Discharge: 2009-11-28 | Payer: Self-pay | Admitting: Internal Medicine

## 2009-12-10 ENCOUNTER — Ambulatory Visit (HOSPITAL_COMMUNITY): Admission: RE | Admit: 2009-12-10 | Discharge: 2009-12-10 | Payer: Self-pay | Admitting: Internal Medicine

## 2009-12-16 LAB — COMPREHENSIVE METABOLIC PANEL
ALT: <8 U/L (ref 0–53)
AST: 16 U/L (ref 0–37)
CO2: 28 mEq/L (ref 19–32)
Sodium: 141 mEq/L (ref 135–145)
Total Bilirubin: 0.3 mg/dL (ref 0.3–1.2)
Total Protein: 6.8 g/dL (ref 6.0–8.3)

## 2009-12-16 LAB — CBC WITH DIFFERENTIAL/PLATELET
BASO%: 0.4 % (ref 0.0–2.0)
LYMPH%: 25.1 % (ref 14.0–49.0)
MCHC: 34.1 g/dL (ref 32.0–36.0)
MONO#: 0.4 10*3/uL (ref 0.1–0.9)
Platelets: 195 10*3/uL (ref 140–400)
RBC: 3.52 10*6/uL — ABNORMAL LOW (ref 4.20–5.82)
WBC: 4.1 10*3/uL (ref 4.0–10.3)
lymph#: 1 10*3/uL (ref 0.9–3.3)

## 2010-03-28 ENCOUNTER — Other Ambulatory Visit: Payer: Self-pay | Admitting: Internal Medicine

## 2010-03-28 DIAGNOSIS — C3490 Malignant neoplasm of unspecified part of unspecified bronchus or lung: Secondary | ICD-10-CM

## 2010-03-30 ENCOUNTER — Encounter: Payer: Self-pay | Admitting: Internal Medicine

## 2010-03-30 ENCOUNTER — Encounter: Payer: Self-pay | Admitting: Thoracic Surgery

## 2010-06-10 ENCOUNTER — Other Ambulatory Visit (HOSPITAL_COMMUNITY): Payer: Self-pay

## 2010-06-25 ENCOUNTER — Ambulatory Visit (HOSPITAL_COMMUNITY)
Admission: RE | Admit: 2010-06-25 | Discharge: 2010-06-25 | Disposition: A | Discharge status: Home / Self Care | Payer: Medicare Other | Source: Ambulatory Visit | Attending: Internal Medicine | Admitting: Internal Medicine

## 2010-06-25 ENCOUNTER — Other Ambulatory Visit: Payer: Self-pay | Admitting: Internal Medicine

## 2010-06-25 ENCOUNTER — Encounter (HOSPITAL_BASED_OUTPATIENT_CLINIC_OR_DEPARTMENT_OTHER): Payer: Medicare Other | Admitting: Internal Medicine

## 2010-06-25 ENCOUNTER — Encounter (HOSPITAL_COMMUNITY): Payer: Self-pay

## 2010-06-25 DIAGNOSIS — Z85118 Personal history of other malignant neoplasm of bronchus and lung: Secondary | ICD-10-CM | POA: Insufficient documentation

## 2010-06-25 DIAGNOSIS — X58XXXA Exposure to other specified factors, initial encounter: Secondary | ICD-10-CM | POA: Insufficient documentation

## 2010-06-25 DIAGNOSIS — N62 Hypertrophy of breast: Secondary | ICD-10-CM | POA: Insufficient documentation

## 2010-06-25 DIAGNOSIS — M954 Acquired deformity of chest and rib: Secondary | ICD-10-CM | POA: Insufficient documentation

## 2010-06-25 DIAGNOSIS — J90 Pleural effusion, not elsewhere classified: Secondary | ICD-10-CM | POA: Insufficient documentation

## 2010-06-25 DIAGNOSIS — S32009A Unspecified fracture of unspecified lumbar vertebra, initial encounter for closed fracture: Secondary | ICD-10-CM | POA: Insufficient documentation

## 2010-06-25 DIAGNOSIS — Z09 Encounter for follow-up examination after completed treatment for conditions other than malignant neoplasm: Secondary | ICD-10-CM | POA: Insufficient documentation

## 2010-06-25 DIAGNOSIS — C3490 Malignant neoplasm of unspecified part of unspecified bronchus or lung: Secondary | ICD-10-CM

## 2010-06-25 DIAGNOSIS — J984 Other disorders of lung: Secondary | ICD-10-CM | POA: Insufficient documentation

## 2010-06-25 DIAGNOSIS — D7389 Other diseases of spleen: Secondary | ICD-10-CM | POA: Insufficient documentation

## 2010-06-25 DIAGNOSIS — C3430 Malignant neoplasm of lower lobe, unspecified bronchus or lung: Secondary | ICD-10-CM

## 2010-06-25 DIAGNOSIS — J9819 Other pulmonary collapse: Secondary | ICD-10-CM | POA: Insufficient documentation

## 2010-06-25 HISTORY — DX: Malignant (primary) neoplasm, unspecified: C80.1

## 2010-06-25 LAB — CBC WITH DIFFERENTIAL/PLATELET
BASO%: 0.4 % (ref 0.0–2.0)
HCT: 35.3 % — ABNORMAL LOW (ref 38.4–49.9)
LYMPH%: 30.4 % (ref 14.0–49.0)
MCHC: 34.3 g/dL (ref 32.0–36.0)
MCV: 99.8 fL — ABNORMAL HIGH (ref 79.3–98.0)
MONO%: 9.9 % (ref 0.0–14.0)
NEUT%: 58.6 % (ref 39.0–75.0)
Platelets: 155 10*3/uL (ref 140–400)
RBC: 3.54 10*6/uL — ABNORMAL LOW (ref 4.20–5.82)

## 2010-06-25 LAB — CMP (CANCER CENTER ONLY)
Alkaline Phosphatase: 64 U/L (ref 26–84)
CO2: 28 mEq/L (ref 18–33)
Creat: 0.9 mg/dl (ref 0.6–1.2)
Glucose, Bld: 90 mg/dL (ref 73–118)
Sodium: 143 mEq/L (ref 128–145)
Total Bilirubin: 0.5 mg/dl (ref 0.20–1.60)
Total Protein: 7.5 g/dL (ref 6.4–8.1)

## 2010-06-25 MED ORDER — IOHEXOL 300 MG/ML  SOLN
80.0000 mL | Freq: Once | INTRAMUSCULAR | Status: AC | PRN
  Administered 2010-06-25: 80 mL via INTRAVENOUS

## 2010-06-30 ENCOUNTER — Other Ambulatory Visit: Payer: Self-pay | Admitting: Internal Medicine

## 2010-06-30 ENCOUNTER — Encounter (HOSPITAL_BASED_OUTPATIENT_CLINIC_OR_DEPARTMENT_OTHER): Payer: Medicare Other | Admitting: Internal Medicine

## 2010-06-30 DIAGNOSIS — C3490 Malignant neoplasm of unspecified part of unspecified bronchus or lung: Secondary | ICD-10-CM

## 2010-06-30 DIAGNOSIS — Z86718 Personal history of other venous thrombosis and embolism: Secondary | ICD-10-CM

## 2010-06-30 DIAGNOSIS — Z85118 Personal history of other malignant neoplasm of bronchus and lung: Secondary | ICD-10-CM

## 2010-07-22 NOTE — Assessment & Plan Note (Signed)
[facility]                         GASTROENTEROLOGY OFFICE NOTE   Christian Scott, Christian Scott                      MRN:          [id number]  DATE:10/18/2006                            DOB:          1920-11-24    REASON FOR CONSULTATION:  Abnormal CT.   Christian Scott is an 77 year old white male referred through the courtesy of  Dr. Arbutus Scott for evaluation.  He has been under Dr. Asa Scott care for  metastatic non-small-cell lung CA.  He has been treated with systemic  chemotherapy and Tarceva.  He has had no progression of his disease over  the last several months.  He did a staging CT exam that suggested wall  thickening of the distal stomach, which was progressive since a previous  exam from July of 2007.  Christian Scott has no GI complaints, including  abdominal pain, nausea, change in bowel habits, melena, or hematochezia.   PAST MEDICAL HISTORY:  Pertinent for a CVA and arthritis.  He is status  post hemorrhoidectomy.  He has Parkinson's disease and is status post  right knee replacement and left hip replacement, and tonsillectomy.   FAMILY HISTORY:  Noncontributory.   MEDICATIONS:  Azilect.  __________ .  Stalevo.  Lasix.  Doxycycline.  Temazepam.  Mobic.   He has no allergies.   He does not smoke.  He does drink occasionally.  He is married and  retired.   REVIEW OF SYSTEMS:  Positive for feet swelling and joint pains.  Some  loss of hearing.   EXAM:  Pulse 56, blood pressure 110/62, weight 171.  HEENT:  EOMI. PERRLA. Sclerae are anicteric.  Conjunctivae are pink.  NECK:  Supple without thyromegaly, adenopathy or carotid bruits.  CHEST:  Clear to auscultation and percussion without adventitious  sounds.  CARDIAC:  Regular rhythm; normal S1 S2.  There are no murmurs, gallops  or rubs.  ABDOMEN:  Bowel sounds are normoactive.  Abdomen is soft, non-tender and  non-distended.  There are no abdominal masses, tenderness, splenic  enlargement or  hepatomegaly.  EXTREMITIES:  Full range of motion.  No cyanosis, clubbing or edema.  RECTAL:  Deferred.   IMPRESSION:  Questionable abnormality of the distal stomach raising the  question of mucosal or submucosal infiltration.   RECOMMENDATION:  Upper endoscopy.     Christian Scott. Christian Dice, MD,FACG  Electronically Signed    RDK/MedQ  DD: 10/18/2006  DT: 10/19/2006  Job #: [id number]   cc:   Christian Matte, MD

## 2010-07-22 NOTE — Letter (Signed)
October 18, 2006    Lajuana Matte, MD  [phone] [address]  [place], [place] [address]   RE:  [name], [name]  MRN:  [id number]  /  DOB:  1920/05/02   Dear Dr. Arbutus Ped:   Upon your kind referral, I had the pleasure of evaluating your patient  and I am pleased to offer my findings.  I saw Christian Scott  in the  office today.  Enclosed is a copy of my progress note that details my  findings and recommendations.   Thank you for the opportunity to participate in your patient's care.    Sincerely,      Barbette Hair. Arlyce Dice, MD,FACG  Electronically Signed    RDK/MedQ  DD: 10/18/2006  DT: 10/19/2006  Job #: [id number]

## 2010-07-25 NOTE — H&P (Signed)
NAME:  Christian Scott, Christian Scott               ACCOUNT NO.:  [id number]   MEDICAL RECORD NO.:  [id number]          PATIENT TYPE:  INP   LOCATION:  NA                           FACILITY:  [facility]   PHYSICIAN:  Ollen Gross, M.D.    DATE OF BIRTH:  01-16-1921   DATE OF ADMISSION:  10/19/2005  DATE OF DISCHARGE:                                HISTORY & PHYSICAL   DATE OF OFFICE VISIT HISTORY AND PHYSICAL:  November 06, 2005.   DATE OF ADMISSION:  October 19, 2005.   CHIEF COMPLAINT:  Right knee pain.   HISTORY OF PRESENT ILLNESS:  The patient is an 77 year old male who has been  seen by Dr. Lequita Halt for ongoing pain in the right knee.  He has known  significant end stage arthritis that has been ongoing for quite some time  now.  He has been treated conservatively in the past including injections  with no benefit.  He is wanting to proceed with knee replacement for quite  some time now.  He was originally scheduled back in March of 2006, but was  noted to have a lung mass, was actively treated for lung cancer.  He states  that his oncologist has cleared him from that standpoint and would like to  pursue surgery.   ALLERGIES:  NO KNOWN DRUG ALLERGIES.   CURRENT MEDICATIONS:  Lodosyn, Pabcopa, temazepam, Aleve, multivitamin and  Tarceva.   PAST MEDICAL HISTORY:  1. Stroke.  2. Stage IV nonsmall cell lung cancer.  3. Diverticulosis.  4. Erectile dysfunction.  5. Osteoarthritis.  6. Parkinson's.  7. History of left leg cellulitis.   PAST SURGICAL HISTORY:  Hemorrhoid surgery in 1948.   SOCIAL HISTORY:  Married, retired.  Occasional intake of alcohol.  Nonsmoker.   FAMILY HISTORY:  Mother with a history of stroke.   REVIEW OF SYSTEMS:  GENERAL:  No fevers, chills, night sweats.  NEURO:  He  has had a stroke in the past, no seizures or syncope.  RESPIRATORY:  No  shortness of breath, productive cough or hemoptysis.  CARDIOVASCULAR:  No  chest pain, angina, orthopnea.  GI:  No nausea,  vomiting, diarrhea.  He does  have some intermittent constipation.  History of hemorrhoids.  GU:  No  dysuria, hematuria or discharge.  MUSCULOSKELETAL:  Right knee.   PHYSICAL EXAM:  VITAL SIGNS:  Pulse 72, respirations 14, blood pressure  112/52.  GENERAL:  An 77 year old white male well-nourished, well-developed with  somewhat of a flat affect, no acute distress.  He is alert and oriented.  He  is accompanied by his wife.  HEENT:  Normocephalic, atraumatic.  Pupils round and reactive.  Oropharynx  clear.  EOMs intact.  NECK:  Supple, no bruits.  CHEST:  Clear anterior and posterior chest walls.  HEART:  Regular rate and rhythm with a grade 2/6 early systolic ejection  murmur best heard over aortic point.  S1, S2 noted.  ABDOMEN:  Soft, nontender, bowel sounds present.  RECTAL/BREASTS/GENITALIA:  Not done, per History of Present Illness.  EXTREMITIES/RIGHT KNEE:  Right knee shows marked crepitus, tender on  palpation, no instability, range of motion of 5-110.   IMPRESSION:  1. Osteoarthritis, right knee.  2. Stage IV nonsmall cell lung cancer.  3. Hemorrhoids.  4. Diverticulosis.  5. Erectile dysfunction.  6. Osteoarthritis.  7. History of Parkinson's.  8. History of stroke 1999.  9. History of left leg cellulitis.   PLAN:  Patient admitted to [facility] to undergo a right total  knee arthroplasty, surgery will be performed by Dr. Ollen Gross.  Patient  wants to look into rehab following the hospital course.      Alexzandrew L. Julien Girt, P.A.      Ollen Gross, M.D.  Electronically Signed    ALP/MEDQ  D:  10/17/2005  T:  10/18/2005  Job:  [id number]   cc:   Lajuana Matte, MD  Fax: [phone]   Ollen Gross, M.D.  Fax: [phone]   Almedia Balls  Fax: [phone]   Charmayne Sheer, M.D.  [address]  [place], [place] [address]

## 2010-07-25 NOTE — Discharge Summary (Signed)
NAME:  Christian Scott, Christian Scott               ACCOUNT NO.:  [id number]   MEDICAL RECORD NO.:  [id number]          PATIENT TYPE:  INP   LOCATION:  1409                         FACILITY:  [facility]   PHYSICIAN:  Corinna L. Lendell Caprice, MDDATE OF BIRTH:  [birth year]   DATE OF ADMISSION:  12/18/2005  DATE OF DISCHARGE:  12/21/2005                                 DISCHARGE SUMMARY   DISCHARGE DIAGNOSES:  1. Aspiration pneumonia.  2. Oropharyngeal dysphasia.  3. History of stage IV lung cancer.  4. Parkinson's disease.  5. Deconditioning.  6. History of stroke.   DISCHARGE MEDICATIONS:  1. Augmentin 500 mg p.o. b.i.d. until gone to complete a 10-day course.  2. He may continue Flomax 0.4 mg daily.  3. Restoril 15 mg p.o. q.h.s.  4. Tarceva 150 mg daily.  5. Lodosyn mg p.o. t.i.d.   FOLLOWUP:  1. He is to follow up with Dr. Arbutus Ped as previously scheduled.  2. Follow-up with Dr. Tresa Endo, his primary care physician, in [facility] in      4 weeks.   CONDITION:  Stable.   ACTIVITY:  Continue outpatient physical therapy.  Continue his walker.   DIET:  Regular with nectar thick liquids or teaspoon sips of thin liquids or  Provale Cup.   PROCEDURES:  None.   PERTINENT LABORATORIES:  Blood cultures negative.  UA showed negative  nitrite, negative leukocyte esterase, negative protein, 15 ketones.  CBC was  unremarkable.  Complete metabolic panel unremarkable.  TSH normal.   SPECIAL STUDIES AND RADIOLOGY:  Two views of the chest showed right lower  lobe airspace disease surrounding the right lower lobe mass, increased left  base airspace disease.  Modified barium swallow with speech consult showed  trace aspiration with thin liquids secondary to delayed swallowing reflex  and pharyngeal residue, weak pharyngeal constriction.   HISTORY AND HOSPITAL COURSE:  Mr. Tarbet is a pleasant unassigned 77-year-  old white male with a history of aspiration pneumonia, oropharyngeal  dysphasia, Parkinson's and lung  cancer who presented to the emergency room  with cough which had worsened over a week.  He was discharged from the  hospital a few months ago on thickened liquids, but, according to his wife,  he was taking thin liquids because he did not think that he had a problem.  There were no fevers, chills, shortness of breath.  She did mention some  right leg swelling, and the patient had had Doppler the week prior to  admission order by Dr. Arbutus Ped which was negative for DVT.  The patient was  afebrile and had a normal white blood cell count.  His lungs were clear, but  he did show a new infiltrate.  I suspect that he may have silently  aspirated, and he was started on Zosyn.  A speech evaluation was done,  results as above.  The patient also was weaker than usual according to his  wife, and he worked with physical therapy prior to discharge.  His oxygen  saturations were normal.  By the time of discharge, he had minimal cough.  His wife reports that his  appetite and strength had improved, and he had  been afebrile throughout the entirety of his stay.  He has a CAT scan  scheduled next month to follow his lung cancer.  If for some reason this is  not done, he will need a repeat chest x-ray to follow up the pneumonia.      Corinna L. Lendell Caprice, MD  Electronically Signed     CLS/MEDQ  D:  12/21/2005  T:  12/21/2005  Job:  [id number]   cc:   Lajuana Matte, MD  Fax: [phone]

## 2010-07-25 NOTE — H&P (Signed)
NAME:  Christian Scott, Christian Scott                         ACCOUNT NO.:  [id number]   MEDICAL RECORD NO.:  [id number]                   PATIENT TYPE:  INP   LOCATION:  5153                                 FACILITY:  [facility]   PHYSICIAN:  Lilyan Punt. Sydnee Levans, M.D.             DATE OF BIRTH:  08/15/1920   DATE OF ADMISSION:  04/14/2002  DATE OF DISCHARGE:                                HISTORY & PHYSICAL   CHIEF COMPLAINT:  My leg hurts to walk.   HISTORY OF PRESENT ILLNESS:  This is a 77 year old white male complaining of  left leg pain, redness, and increased lower extremity edema for the past two  to three days.  He was seen today by Vikki Ports, M.D., in the office  and sent for a direct admission because of the concern of ascending  cellulitis.  The patient has had chronic venous insufficiency with bilateral  ankle edema for quite some time, which was evaluated in 2003 by his primary  doctor and orthopedist.  Venous Dopplers were negative.  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  Over the past few days, the patient denies any events  such as chest pain, dizziness, shortness of breath, abdominal pain, or  fevers.  He did have a solitary episode of nausea two days ago without  emesis, as well as some cough.  At baseline he has mild dyspnea on exertion  manifested as slight windedness after climbing up one flight (14 steps) of  stairs at home.   REVIEW OF SYSTEMS:  Denies any cold or flu symptoms.  No UTI symptoms or  nocturia.  He notes that the cough was dry and intermittent.  There has also  been some intermittent stomach pain referred to as acid reflux, a self-  limiting process that has never led him to take medications.  There have  been on bowel habit changes.  He tells me that he has had a rectal exam by  Miguel Aschoff, M.D., within the past year and that he was sent to someone for  further testing  (presumably colonoscopy, though he is not certain about  this).  The remainder of the ROS is as per HPI.   MEDICATIONS:  1. Requip 3 mg daily since 2003.  2. Diclofenac 75 mg b.i.d. per Humberto Leep. Wyonia Hough, M.D., for daily pain.   ALLERGIES:  NKDA.   PAST MEDICAL HISTORY:  1. Parkinson's disease initially in June of 2001 per [facility].     Recently seen by Santina Evans A. Orlin Hilding, M.D., in September of 2003.  A     brain MRI in June of 2001 showed small vessel ischemia and mild atrophy.  2. Venous insufficiency, chronic.  3. Cataracts.  He had surgery in 1998 and Lasik surgery at [facility].  4. Overweight.  5. Tonsillectomy.  6. Hemorrhoidectomy.  SOCIAL HISTORY:  He is married.  He runs a [employer].  His  children are involved with the business.  While married, he lives separately  from his wife for the past five years.  He stays with his children, but  typically drives, cooks, and cares for himself independently despite minimal  bradykinetic problems of his PD.  He has never smoked.  He drinks alcohol,  two gin drinks every night.   FAMILY HISTORY:  His mother died at 33 with a stroke and diabetes.  His  father died of a car accident when the patient was 74.  He has three  brothers.  One, Ebony Cargo, died with a heart attack in his 48s.  A sister died  in her 36s of a heart disease also.   PHYSICAL EXAMINATION:  GENERAL APPEARANCE:  This is a pleasant male whose  affect is flattened.  He is younger looking than his age of 31.  VITAL SIGNS:  Afebrile with a temperature of 97.6 degrees, pulse 65,  respirations 20, BP 133/74, and O2 saturation 97% on room air.  SKIN:  On his face, he has rosacea involving the nose and nasolabial areas  with telangiectasias and a ruddy complexion of the nose.  On his chest, he  has multiple seborrheic keratoses, as well as on the scalp.  The scalp is  balding.  His lower extremities revealed marked erythema of the left lower   extremity with warmth and swelling involving toes, 3, 4, and 5, across the  foot dorsum to his shin and calf with clear demarcation just below the knee  level.  Beyond this heading northward, there is lymphangitic streaking from  the medial aspect of the left knee along the thigh to the inguinal region of  his groin.  The skin of the lower extremities is diffusely flaky and scaly  over these erythematous surfaces.  The toenails of both feet are heavily  invested with onychomycosis and onycholysis.  There is marked maceration in  the web spaces of all five toes with a notable fissure between digits 4 and  5, probable portal of entry.  There is, however, no drainage, oozing, or  pus.  There is some malodor.  EXTREMITIES:  The extremities grossly reveal mild edema at its greatest on  the left graded 3-4+ and 2-3+ on the right.  Asymmetric calf swelling noted.  The pulses are full in DP, PT, and popliteal regions bilaterally.  Femoral  pulses are intact.  NECK:  Carotid upstroke brisk.  LUNGS:  Essentially clear with good respiratory effort, but scant crackles  were notably initially at the right and left bases and these seem to clear  with cough.  CARDIAC:  Without murmur, rub, or gallop.  No S3.  ABDOMEN:  Soft.  ND and NT.  There is no hepatosplenomegaly.  GENITOURINARY:  Descended testes.  No mass.  There is no scrotal edema.  The  scrotum is inflamed and there appears to be tinea cruris in the groin vault.   LABORATORY DATA:  Pending and include BMET, CBC, TSH, and UA.  Also pending,  ECG, chest x-ray, and venous Doppler.   ASSESSMENT:  A nice male with mild Parkinson's disease admitted for a  thinning cellulitis and lymphangitic streaking of the left lower extremity.  It is apparent that it may have emanated from an area between digits 4 and 5  on the left foot.  His edema, though chronic, is significant on that side and his ROS  does suggest exertional dyspnea, which raises a question  of a  possible cardiogenic focus for chronic edema.   PLAN:  The plan thus is for admission.  He was sent directly from the office  to unit 5100.  We will begin antimicrobial therapy targeting typical skin  pathogens, i.e. Gram positives, starting Ancef 1 g q.6h.  Further assessment  of his vascular status with venous Dopplers to rule out DVT, though I do  note a negative Doppler back in April of 2003 with his orthopedist.  Assess  his cardiac function further.  Obtain an ECG and 2-D echocardiogram to  assess his LV ejection fraction.  Also, PA and lateral chest x-ray to rule  out pulmonary edema.  At this point, I have started on low-dose aspirin for  cardiac protection and have taken him off of the diclofenac that he has been  taking for some time which may also be explaining his edema.  Will keep him  at bed rest with his feet elevated, but allow bathroom privileges.  Progressive ambulation as things improve.  He will followed by the [facility] team.                                               Lilyan Punt. Sydnee Levans, M.D.    KCS/MEDQ  D:  04/14/2002  T:  04/14/2002  Job:  [id number]   cc:   Miguel Aschoff, M.D.  [address], [address]  [place]  [place]  [address]  Fax: [phone]

## 2010-07-25 NOTE — Consult Note (Signed)
NAME:  Christian Scott, Christian Scott               ACCOUNT NO.:  [id number]   MEDICAL RECORD NO.:  [id number]          PATIENT TYPE:  INP   LOCATION:  1516                         FACILITY:  [facility]   PHYSICIAN:  Hollice Espy, M.D.DATE OF BIRTH:  [birth year]   DATE OF CONSULTATION:  10/20/2005  DATE OF DISCHARGE:                                   CONSULTATION   MEDICINE CONSULTATION:   REQUESTING PHYSICIAN:  Dr. Ollen Gross.   REASON FOR CONSULTATION:  Medical management of aspiration pneumonia.   PRIMARY CARE PHYSICIAN:  Dr. Charmayne Sheer of [place], [place].   HISTORY OF PRESENT ILLNESS:  The patient is an 77 year old white male with a  past medical history of stage IV nonsmall cell lung cancer and CVA and  diverticulosis who was admitted on October 19, 2005 for a right total knee  arthroplasty secondary to osteoarthritis.  The patient was noted to have a  lung mass in 2006 and is currently being followed by Dr. Arbutus Ped of the  oncologists for this.  Dr. Arbutus Ped cleared the patient for surgery and the  patient underwent the arthroplasty without any type of acute complication on  October 19, 2005.  Postop the patient did well and had no complaints.  He had  no febrile episodes overnight.  On October 20, 2005, postop day #1, the  patient was complaining of some cough and congestion after swallowing  liquid.  He also was noting some yellowish sputum.  He told me that he felt  short of breath this morning but feels better now while he is on room air.  Dr. Lequita Halt, orthopedic surgery ordered a chest x-ray which was done and  showed evidence of low volume chest film with vascular crowding and basilar  atelectasis.  The radiologist suspected a superimposed bilateral lower lobe  infiltrate concerning for aspiration.  Both noted a stable right lower lobe  lung lesion.  Previous chest x-ray was compared to one done on October 13, 2005, a preop chest x-ray, which noted a continued right lower lobe  mass and  persistent medial left lower lobe atelectasis versus airspace disease.  With  these changes in findings [facility] Hospital[facility] were consulted for management.  Currently the patient is doing okay.  He complains of some pain in his leg  where he is postop as well as on his tailbone from being positioned in bed.  He denies any current headaches, vision changes, dysphagia, chest pain,  palpitations, shortness of breath, wheezing, he has had a cough earlier but  none now, no abdominal pain, no hematuria, dysuria, constipation, diarrhea  or focal extremity numbness.   REVIEW OF SYSTEMS:  Otherwise negative.   PAST MEDICAL HISTORY:  The patient's past medical history includes CVA,  stage IV nonsmall cell lung cancer, diverticulosis, erectile dysfunction,  osteoarthritis, he is status post a right total knee arthroplasty, history  of Parkinson's disease and history of left leg cellulitis.   MEDICATIONS:  The patient at home is on Lodosyn two tablets p.o. t.i.d.,  Parcopa 25 mg two tabs p.o. t.i.d., daily laxatives and oxycodone as well  as  temazepam, Aleve, and multivitamin.  He is also on a medicine called  Tarceva.   ALLERGIES:  NO KNOWN DRUG ALLERGIES.   SOCIAL HISTORY:  He denies any tobacco, alcohol or drug use.  He is married  and lives with his wife with children in the area.   FAMILY HISTORY:  Noncontributory.   PHYSICAL EXAMINATION:  VITAL SIGNS:  The patient's vitals on admission  temperature 99.3, heart rate 70, blood pressure 118/50, respirations 18, O2  saturation 91% on room air.  GENERAL:  The patient is alert and oriented x3 in no apparent distress  although he is soft spoken.  HEENT:  Normocephalic, atraumatic.  His mucous membranes are slightly dry.  He has no carotid bruits.  HEART:  Regular rate and rhythm.  LUNGS:  He has an occasional rale but otherwise clear to auscultation  bilaterally.  I hear no crackles.  ABDOMEN:  Soft, nontender, nondistended, with  positive bowel sounds.  EXTREMITIES:  His left leg is casted and raised.   LABORATORY WORK:  Chest x-ray is as per HPI.  White count for today is 4.4  with an H&H of 9.1 and 26.5, MCV of 101, platelet count 156, INR is at 1.  BMET:  Sodium 141, potassium 4.9, chloride 109, bicarb 29, BUN 12,  creatinine 1, glucose 147.   ASSESSMENT AND PLAN:  1. Suspected aspiration pneumonia.  Given the patient's previous history      of nonsmall cell lung cancer it certainly can be concerning and would      favor covering with broad-spectrum antibiotics, specifically      intravenous Zosyn 3.375 grams q.6.  We will also put the patient back      on oxygen 2 liters, although he is already saturating just right above      90%.  In addition, I encouraged him to continue to use inspirometer      which has already been placed by his bedside.  We went over how to use      it and he showed me with current inspiratory efforts around 1500 mL.  I      encouraged him to do it minimal one to two times every hour while awake      which he says he will definitely do and I explained why the need to do      this would help.  We will continue to follow this.  2. History of nonsmall cell lung cancer stage IV.  He is being followed by      Dr. Arbutus Ped.  3. Macrocytic anemia.  This may be from either malnutrition or B12 folate      deficiency.  At this time he appears to be relatively stable and the      drop in H&H today is at most I believe secondary to postoperative blood      loss and would favor following this up as an outpatient.  4. History of osteoarthritis.  5. History of Parkinson's.  6. History of cerebrovascular accident.  7. History of diverticulosis.  These issues are stable.      Hollice Espy, M.D.  Electronically Signed     SKK/MEDQ  D:  10/20/2005  T:  10/21/2005  Job:  [id number]   cc:   Ollen Gross, M.D.  Fax: [phone]   Charmayne Sheer, MD [facility]  [address]  [place], [place] [address]

## 2010-07-25 NOTE — Discharge Summary (Signed)
NAME:  Christian Scott, Christian Scott               ACCOUNT NO.:  [id number]   MEDICAL RECORD NO.:  [id number]          PATIENT TYPE:  IPS   LOCATION:  4007                         FACILITY:  [facility]   PHYSICIAN:  Ellwood Dense, M.D.   DATE OF BIRTH:  January 26, 1921   DATE OF ADMISSION:  10/23/2005  DATE OF DISCHARGE:                                 DISCHARGE SUMMARY   DISCHARGE DIAGNOSES:  1. Right total knee replacement secondary to degenerative joint disease      October 19, 2005.  2. Dysphagia.  3. Pain control.  4. Coumadin for deep vein thrombosis prophylaxis.  5. Postoperative anemia.  6. Aspiration pneumonia.  7. Stage IV non-small-cell lung cancer.  8. Parkinson's disease.  9. History of cerebrovascular accident in 2001 without residual.  10.Diverticulosis.   An 77 year old white male history of stage IV non-small-cell lung cancer  diagnosed in March 2006, Parkinson's disease, admitted October 19, 2005, to  [facility] with end-stage changes of the right knee and no relief  with conservative care.  Underwent a right total knee replacement August 13  per Dr. Lequita Halt.  Placed on Coumadin for deep vein thrombosis prophylaxis,  weightbearing as tolerated.  Postoperative anemia 7.8, transfused 2 units of  packed red blood cells August 15, hemoglobin improved to 9.4.  On August 14  with noted cough, chest congestion.  Followup chest x-ray questionable  aspiration pneumonia.  Placed on intravenous Zosyn.  Followup chest x-ray  August 16 with improved aeration.  Modified barium swallow August 16, placed  on a dysphagia 3 nectar-thick liquid diet.  The patient had pulled his IV  lock on August 17 noted after improved chest x-ray.  It was felt his  intravenous antibiotic could be discontinued with planned followup chest x-  ray.   PAST MEDICAL HISTORY:  See discharge diagnoses.  Occasional alcohol.  No  tobacco.   ALLERGIES:  None.   SOCIAL HISTORY:  Married, wife can assist but with  limited lifting.  Local  family works.  One-level home, two steps to entry.   MEDICATION PRIOR TO ADMISSION:  1. Parcopa 2.5 mg twice daily.  2. Tarceva 150 mg daily.  3. Restoril 15 mg at bedtime.  4. Lodosyn 2.5 mg twice daily.  5. Multivitamin daily.  6. Azilect 1 mg daily.   REHABILITATION HOSPITAL COURSE:  The patient was admitted to inpatient rehab  services with therapies initiated on a 3-hour-daily basis consisting of  physical therapy, occupational therapy and rehabilitation nursing.  The  following issues were addressed during the patient's rehabilitation stay.  Pertaining to Mr. Bressman right total knee replacement secondary to  degenerative joint changes August 13, surgical site healing nicely, Steri-  Strips in place, followed by Dr. Ollen Gross of orthopedic services.  Pain  control with Vicodin and Robaxin with good results.  The patient was  essentially minimum assist for bed mobility, ambulating short household  distances with a walker.  Home health therapies had been arranged.  Coumadin  for deep vein thrombosis prophylaxis with latest INR of 2.7.  He would be  followed by [place] home  health agency.  Postoperative anemia stable with  latest hemoglobin of 9.4, hematocrit 27.1.  maintained on iron supplement.  Followup chest x-ray after aspiration pneumonia, diet had since been  downgraded, showed improved aeration.  Oxygen saturations greater than 90%  on room air.  It was felt his dysphagia was secondary to his Parkinson's  disease.  Followup swallow studies were pending.  He was followed by speech  therapy.  He will continue to follow up with neurology service, Dr. Orlin Hilding,  concerning his Parkinson's disease.  He remained on his Lodosyn.  Stage IV  non-small-cell lung cancer, continue on Tarceva and follow up with Dr.  Arbutus Ped of oncology services.  The patient was discharged to home in stable  condition.   DISCHARGE MEDICATIONS AT THE TIME OF DICTATION:  1.  Coumadin 2 mg daily until November 19, 2005, and stop.  2. Trinsicon one capsule twice daily.  3. Multivitamin daily.  4. Azilect 1 mg daily.  5. Lodosyn 25 mg twice daily.  6. Protonix 40 mg daily.  7. Tarceva 150 mg daily.  8. Flomax 0.4 mg at bedtime.  9. Restoril 15 mg bedtime as needed.  10.Vicodin one or two tablets every 4 hours as needed, dispense of #60.   ACTIVITY:  As tolerated with supervision for safety.   DIET:  Regular.   WOUND CARE:  Cleanse incision daily with soap and water.   SPECIAL INSTRUCTIONS:  Home health, [facility] home health agency, to complete  Coumadin protocol.      Mariam Dollar, P.A.    ______________________________  Ellwood Dense, M.D.    DA/MEDQ  D:  11/04/2005  T:  11/04/2005  Job:  [id number]   cc:   Ollen Gross, M.D.  Catherine A. Orlin Hilding, M.D.  Lajuana Matte, MD  Dr. Maple Hudson

## 2010-07-25 NOTE — H&P (Signed)
NAME:  SALATHIEL, FERRARA               ACCOUNT NO.:  [id number]   MEDICAL RECORD NO.:  [id number]          PATIENT TYPE:  IPS   LOCATION:  4147                         FACILITY:  [facility]   PHYSICIAN:  Erick Colace, M.D.DATE OF BIRTH:  04-24-1920   DATE OF ADMISSION:  02/02/2006  DATE OF DISCHARGE:                              HISTORY & PHYSICAL   CHIEF COMPLAINT:  Declining self-care mobility following left total hip  replacement.   HISTORY OF PRESENT ILLNESS:  An 77 year old male with Parkinson's  disease as well as stage IV non-small-cell lung CA has had left hip  pain/OA unresponsive to conservative treatment.  He elected to undergo a  left THR on January 29, 2006, by Dr. Lequita Halt and placed on Coumadin for  DVT prophylaxis; INR 1.7 today.  He is weightbearing as tolerated.  He  has been started on PT/OT where he has been participating but still  requiring physical assistance.  He had postop blood loss anemia.  Transfused 2 units of packed red blood cells.  Followed for hypotension,  decreased urine output.  Seen by the [facility] hospitalist.  He has a Foley  in place, reportedly poor p.o. intake.  Some increased confusion noted  per wife today.   REVIEW OF SYSTEMS:  Reflux.   PAST MEDICAL HISTORY:  1. Chronic dysphagia.  He had a hospitalization for aspiration      pneumonia earlier this fall.  2. Prior history of diverticulosis.  3. Bilateral cataracts.  He has dry eyes.  4. Erectile dysfunction.  5. A history of left lower extremity cellulitis.  6. Prior history of CVA with right upper extremity weakness.   PAST SURGICAL:  1. Right TKR in the summer and was at rehab for this.  2. Patient with bilateral cataracts.   FAMILY HISTORY:  Positive CAD.   SOCIAL HISTORY:  Married.  Lives in a level home.  No steps to enter.   FUNCTIONAL HISTORY:  Ambulated with a cane prior to admission.   FUNCTIONAL STATUS:  Impaired mobility with self-care.   MEDICATIONS AT HOME:  1.  __________ 25 mg.  2. Temazepam 15 p.o. daily.  3. __________ q.h.s.  4. Azilect 1 mg daily.  5. Carbidopa 25 mg daily.  6. Tarceva 150 mg p.o. daily.   Last hemoglobin on November 27th was 8.6 with a white count of 3.2.  BUN  and creatinine 12 and 0.9 on November 25th.   EXAMINATION:  GENERAL:  An elderly male, masked facies, brady-kinetic  movements.  He is drowsy but cooperative.  He will answer questions.  He  has hypophonic/hypokinetic dysarthria.  He is oriented to person, to  hospital, but not to time.  He cannot recall the name of the hospital.  HEENT:  Normal.  NECK:  Supple without lymphadenopathy.  RESPIRATORY:  Good, lungs clear.  HEART:  Occasional ectopic; otherwise regular.  EXTREMITIES:  1+ edema, left greater than right lower extremity.  Left  hip, he has a healing incision.  No evidence of drainage or erythema.  ABDOMEN:  With positive bowel sounds but mildly distended.  Motor strength is  5/5 bilateral upper extremities.  Left hip flexion is  trace, quads trace, TA is 4, gastroc is 4, right lower extremity 4, and  the hip flexor quads, TA and gastroc cannot assess sensation.   IMPRESSION:  1. Left hip osteoarthritis status post THR (total hip replacement).      Start physical therapy/occupational at a CR level.  Rehab RN      (registered nurse) for bowel and bladder care, discontinue Foley,      in/out catheter as needed, monitor eyes and nose.  2. Pain management.  Discontinue Vicodin secondary to      sedation/disorientation, will hold on Ultram and just use Tylenol      as well as Robaxin.  3. Deep vein thrombosis prophylaxis.  Continue Coumadin as per      pharmacy protocol.  Since he is therapeutic, we will add      subcutaneous Lovenox daily, 40 mg.  4. Parkinson's disease.  Continue his current home medications.  5. Dysphagia.  Monitor for signs and signs of aspiration, per rehab      nursing.  If needed, will get a modified barium swallow per  Speech.   We will check a.m. labs:  CBC, BMET.  May need supplement p.o. with IV  (intravenous) fluids if he is exhibiting prerenal azotemia.  His ELOS is  2 weeks.  Estimated function on discharge is 24-hour supervision,  intermittent minimum assist.      Erick Colace, M.D.  Electronically Signed     AEK/MEDQ  D:  02/02/2006  T:  02/03/2006  Job:  [id number]   cc:   Lajuana Matte, MD  Ollen Gross, M.D.

## 2010-07-25 NOTE — Discharge Summary (Signed)
NAME:  BLAYN, WHETSELL               ACCOUNT NO.:  [id number]   MEDICAL RECORD NO.:  [id number]          PATIENT TYPE:  INP   LOCATION:  1516                         FACILITY:  [facility]   PHYSICIAN:  Ollen Gross, M.D.    DATE OF BIRTH:  [birth year]   DATE OF ADMISSION:  10/19/2005  DATE OF DISCHARGE:  10/23/2005                                 DISCHARGE SUMMARY   ADMITTING DIAGNOSES:  1. Osteoarthritis right knee.  2. Stage IV non-small cell lung cancer.  3. Hemorrhoids.  4. Diverticulosis.  5. Erectile dysfunction.  6. Osteoarthritis.  7. History of Parkinson's.  8. History of stroke in 1999.  9. History of left leg cellulitis.   DISCHARGE DIAGNOSES:  1. Osteoarthritis right knee, status post right total knee arthroplasty.  2. Aspiration pneumonia.  3. Acute blood loss anemia.  4. Status post transfusion without sequelae.  5. Osteoarthritis right knee.  6. Stage IV non-small cell lung cancer.  7. Hemorrhoids.  8. Diverticulosis.  9. Erectile dysfunction.  10.Osteoarthritis.  11.History of Parkinson's.  12.History of stroke in 1999.  13.History of left leg cellulitis.   PROCEDURE:  October 19, 2005, right total knee.   SURGEON:  Ollen Gross, M.D.   ASSISTANT:  Cherly Beach, P.A.-C.   ANESTHESIA:  General.   TOURNIQUET TIME:  42 minutes.   CONSULTS:  1. Medical services, Hollice Espy, M.D.  2. Rehab services.   BRIEF HISTORY:  Mr. Hausmann is an 77 year old male with end-stage arthritis  of the right knee, with intractable pain, extensive nonoperative management,  including multiple series of injections, now presents for right total knee  arthroplasty.   LABORATORY DATA:  Preop CBC with hemoglobin 12.3, hematocrit 36.8, white  cell count 3.5.  Postop hemoglobin 9.6, drifting down to 9.1, then down to  7.8.  Given blood.  Post-transfusion hemoglobin 9.4 and 27.3.  PT/PTT on  admission 13.0 and 33 respectively, INR 1.0.  Serial pro-time follows with  PT/INR of 25.2 and 2.2.  Chem panel on admission all within normal limits.  Serial BMETs were followed.  Remainder of electrolytes remained within  normal limits.  Renal function remained within normal limits.  Urinalysis  negative.  Blood group type O-.  Two view chest, on October 13, 2005, no new  finding in chest, decrease in right lower lobe mass compared to Jul 13, 2004.  Currently mass measures 3 x 3.5.  Persistent medial left lower atelectasis  air space with left lower lobe calcified granulomas.  Changes are stable  compared to the September 15, 2005 CT.  Portable chest, October 20, 2005:  Low  volume chest with vascular crowding and basilar atelectasis, but there is a  superimposed bilateral lower lobe infiltrate and aspiration is certainly a  consideration.  Stable right lower lobe lung lesion.  Swallowing study,  October 22, 2005:  Swallowing function study was performed.  See pathology  report.  Portable chest, October 22, 2005:  Improved aeration, but persistent  bibasilar air space disease with mild edema.   HOSPITAL COURSE:  Patient admitted to [facility], tolerated the  procedure  well, later to the recovery room, to orthopedic floor.  Started on  PCA and p.o. analgesic for pain control following surgery.  Was doing pretty  well on the morning of day one, did have some drainage around the Hemovac.  Hemovac was discontinued without difficulty.  Consult rehab services and  felt that, due to the medical issues and anticipation of therapy, the  patient would benefit from undergoing rehab stay.  They followed along.  Starting getting up out of bed.  Day two patient was having some congestion  and cough after swallowing liquids.  Chest x-ray was done and reported as  above.  Due to the possibility of aspiration, there was some concern that he  had done this, consult was called and Dr. Rito Ehrlich evaluated the patient,  started on hand-held nebulizers, encouraged incentive spirometer.   Patient  was seen by Dr. Virginia Rochester, suspected aspiration pneumonia, put on  broad spectrum antibiotics, IV Zosyn, back on oxygen, encouraged incentive  spirometer efforts, and followed along.  Swallowing study was ordered.  Made  NPO, was given blood because of the drop in the hemoglobin, tolerated the  blood well.  After the swallowing study felt to be aspiration with thins  secondary to pharyngeal residue, poor pharyngeal constriction, needed  dysphagia 3, mechanical soft, nectar thick liquids, full supervision,  crushing meds, sitting upright, recommend close monitoring to prevent over  sedation, and had some confusion with the morphine so that was discontinued.  From a therapy standpoint he was very slow with therapy and he had actually  had to stop therapy because of some of the issues with the aspiration  pneumonia.  Once he was placed on his diet and able to get back in therapy,  it was noted that a bed became available in the rehab later that afternoon,  on October 23, 2005.  It was felt he was a good candidate and, once he was  set up with his diet and he was on his antibiotics, he was transferred over  to [facility] [place].   DISCHARGE PLAN:  1. Patient transferred to [facility].  2. Discharge diagnoses - please see above.  3. Discharge medications - continue current medications as per the [facility].      Discontinue the morphine and begin Vicodin.  4. Diet - he is on a dysphagia 3 diet with nectar thick liquids, full      supervision, crushing meds, sitting up for 90 degrees, small bites.  5. Activity - weightbearing as tolerated the right lower extremity.  PT      and OT for gait training, ambulation, ADLs, total knee protocol.  6. Follow up two weeks from surgery or following the discharge from rehab      unit.   DISPOSITION:  [facility].   CONDITION UPON DISCHARGE:  Slowly improving, medically stable at time of  transfer.     Alexzandrew L. Julien Girt,  P.A.      Ollen Gross, M.D.  Electronically Signed    ALP/MEDQ  D:  11/12/2005  T:  11/13/2005  Job:  [id number]   cc:   Ollen Gross, M.D.  Fax: [phone]   Lajuana Matte, MD  Fax: [phone]   Almedia Balls  Fax: [phone]   Rehab Services   Dr. Lanney Gins, M.D.

## 2010-07-25 NOTE — H&P (Signed)
NAME:  Christian Scott, Christian Scott               ACCOUNT NO.:  [id number]   MEDICAL RECORD NO.:  [id number]          PATIENT TYPE:  INP   LOCATION:  0379                         FACILITY:  [facility]   PHYSICIAN:  Mobolaji B. Bakare, M.D.DATE OF BIRTH:  1921-02-16   DATE OF ADMISSION:  07/12/2004  DATE OF DISCHARGE:                                HISTORY & PHYSICAL   PRIMARY CARE PHYSICIAN:  Dr. Tresa Endo in [facility].   ONCOLOGIST:  Dr. Arbutus Ped.   GASTROENTEROLOGIST:  [facility].   CHIEF COMPLAINT:  Bright red blood per rectum.   HISTORY OF PRESENTING COMPLAINT:  Christian Scott is a pleasant 77 year old  Caucasian male with history of Parkinson's disease and a recent diagnosis of  lung cancer in April 2006.  He is currently undergoing radiation therapy and  chemotherapy.   The patient noticed about 7 p.m. last night bright red blood per rectum  after he moved his bowel. This was painless He claimed that the stool was  not hard and he did not have to strain ever.  After moving a small quantity  of stool, he noticed dripping of blood per rectum, and this persisted until  he got to the emergency department about 9 p.m.  Currently, there is no  active bleeding.   Of note is that Christian Scott was here in the emergency department 3 days ago,  complaining of constipation, and he was treated with fecal disimpaction and  given prescription for MiraLax.  He has been using GlycoLax and megastool  since then. There is no accompanying fever, no vomiting, and no nausea.  He  stated he has lost some weight but could not quantify.  Appetite is reduced.   Christian Scott had a colonoscopy by [facility] about 1 and 1/2  years ago.  This was a routine colonoscopy, and, as per wife, everything was  okay.   REVIEW OF SYSTEMS:  There is no shortness of breath.  He does have cough,  which is productive of sputum.  No dizziness.  No fever.  He had some  difficulty and straining at micturition 3 days  ago, and this has resolved.  No dysuria.  The patient denies use of NSAID or aspirin.   PAST MEDICAL HISTORY:  1.  Parkinson's disease.  2.  Hemorrhoid, status post hemorrhoidectomy about 50 years ago.  3.  Lung cancer, involving the right lung.  He is undergoing chemotherapy      and radiation therapy.  He had video-assisted biopsy in April 2006.  4.  History of constipation.  5.  History of cerebrovascular accident.   ALLERGIES:  No known drug allergies.   MEDICATIONS:  1.  Lodosyn two tablets t.i.d.  2.  Parcopa 25 mg two tablets p.o. t.i.d.  3.  GlycoLax, megastool.  These are over-the-counter medications.  4.  Oxycodone.   FAMILY HISTORY:  The patient is married and lives with his wife.  He has  some of his children who live in the area.   SOCIAL HISTORY:  He does not smoke cigarettes.  He does not drink alcohol.  He is fairly  independent of activities of daily living.   PHYSICAL EXAMINATION:  VITAL SIGNS:  Blood pressure 133/70, pulse of 103,  respiratory rate of 22, and oxygen saturations of 98%.  Blood pressure was  markedly orthostatic with a blood pressure of 129/79 lying with pulse of 97,  120/74 sitting with a pulse of 99, and blood pressure 70/52 standing with a  pulse of 114.  GENERAL:  He is comfortable, not in respiratory distress.  HEENT:  Normocephalic, atraumatic head.  Respiratory rate of 20.  Pupils  equal, round, and reactive to light.  Extraocular muscle movement intact.  No supraclavicular lymphadenopathy.  No elevated JVD.  LUNGS:  With clear breath sounds with easy air entry in lung bases.  CVS:  S1 and S2 regular.  No murmur, no gallop, and no rub.  ABDOMEN:  Obese, soft, and nontender.  No palpable organomegaly.  Bowel  sounds present.  No holosystolic murmur.  RECTAL:  Digital rectal examination:  External hemorrhoids noted in 1  o'clock, 5 and 9 o'clock positions with blood clots in the anus.  No active  bleeding noted.  EXTREMITIES:  No calf  tenderness.  Dorsalis pedis pulses palpable  bilaterally.  Trace pedal edema bilaterally.  CNS:  No focal neurological deficits.  SKIN:  No rash.  No petechiae.   INITIAL LABORATORY DATA:  PTT 32, PT 13.2, and INR 1.0.  White cell 9.2,  hemoglobin 11.1, hematocrit 33.5, and platelets 334; neutrophils 84%,  lymphocytes 11%, atypical lymphocytes with a mild left shift with occasional  bands and large platelets present.  Sodium 140, potassium 3.5, chloride 107,  bicarb 24, glucose 149, BUN 26, creatinine 1.0.  Calcium 8.8.  Please note  that hb/hct in March 06 were 11.3/34.1   ASSESSMENT AND PLAN:  Christian Scott is a pleasant 77 year old Caucasian male  with history of lung cancer, undergoing treatment, who is presenting with  bright red blood per rectum.  1. Lower GI bleed.  Will admit him to  telemetry and bolus with IV fluid 1 L now and continue at 125 mL/hour.  Check H&H q.6h.  Type and screen, 3 units of packed red blood cells.  Protonix 40 mg IV daily.  Will obtain GI consult in the morning.  2. Lung  cancer.  Will contact Dr. Arbutus Ped.  The patient's next radiation therapy is on  Monday, Jul 14, 2004, at 10 a.m. and chemotherapy in 2 weeks.  3. Parkinson's  disease.  I will continue all medications.  I did not have correct  medication list.  I have asked family to bring in medications.  4. DVT  prophylaxis.  Will use sequential compression devices.      MBB/MEDQ  D:  07/13/2004  T:  07/13/2004  Job:  [id number]   cc:   Almedia Balls  [address]  [place]  [place] [address]  Fax: [phone]   Lajuana Matte, MD  Fax: [phone]

## 2010-07-25 NOTE — H&P (Signed)
NAME:  Christian Scott, Christian Scott               ACCOUNT NO.:  [id number]   MEDICAL RECORD NO.:  [id number]          PATIENT TYPE:  INP   LOCATION:  1409                         FACILITY:  [facility]   PHYSICIAN:  Corinna L. Lendell Caprice, MDDATE OF BIRTH:  [birth year]   DATE OF ADMISSION:  12/18/2005  DATE OF DISCHARGE:                                HISTORY & PHYSICAL   CHIEF COMPLAINT:  Cough.   HISTORY OF PRESENT ILLNESS:  Christian Scott is an unassigned 77 year old white  male with multiple medical problems, including a history of aspiration  pneumonia, dysphagia, lung cancer and Parkinson's disease.  He was brought  to the emergency room by his wife with cough for a week.  He saw his primary  care doctor in [facility], Dr. Tresa Endo, last week for the cough.  A chest x-  ray was done and he was given cough suppressants; no antibiotics..  The  patient, according to discharge summary a few months ago, has a history of  aspiration and oropharyngeal dysphagia.  He was supposed to be on dysphagia  3 diet with thickened liquids.  His wife reports that he stopped having the  choking problem and therefore put himself back on thin liquids.  The  patient denies any fevers or chills.  His appetite has been poor.  She also  notes that his right leg has been swollen for a few weeks.  A Doppler of the  leg was done a week ago by Dr. Shirline Frees, and no DVT was found.   PAST MEDICAL HISTORY:  1. Parkinson's disease.  2. Stage IV lung cancer.  3. History of stroke in 1999.  4. History of oropharyngeal dysphagia.  5. History of aspiration with thin liquids.  6. History of right total knee arthroplasty in August 2007.   MEDICATIONS:  1. Flomax 0.4 mg nightly.  2. __________  1 mg daily.  3. Lodosyn t.i.d.  4. Temazepam 15 mg p.o. q.h.s.  5. __________  for see the 150 mg a day.   ALLERGIES:  NO KNOWN DRUG ALLERGIES.   SOCIAL HISTORY:  The patient's wife reports he has an advanced directive  which shows NO LONG TERM  life support.  No history of smoking.  He drinks  occasionally.   FAMILY HISTORY:  Noncontributory.   REVIEW OF SYSTEMS:  CONSTITUTIONAL:  His appetite has been poor; otherwise  as above.  HEENT: He has a history of chronic drainage from his eyes, but it  has been more purulent recently.  He saw an ophthalmologist a year ago and  was told he had dry eyes.  No sore throat.  RESPIRATORY: As above.  CARDIOVASCULAR: No chest pains or palpitations.  GI: No vomiting or  diarrhea.  GU: No dysuria.  MUSCULOSKELETAL: He is complaining of a cramp in  his left leg. ENDOCRINE:  No diabetes.  NEUROLOGIC:  As above.  HEMATOLOGIC:  No history of thromboembolism.  PSYCHIATRIC:  No depression.  SKIN:  He has  a sore on his right leg from a shoe that has been slow to heal.   PHYSICAL EXAMINATION:  VITAL SIGNS:  Temperature 99.1, blood pressure  128/65, pulse 79, respiratory rate 16, oxygen saturation 96% on room air.  GENERAL:  The patient is an elderly white male in no acute distress.  HEENT: Normocephalic, atraumatic.  Pupils equal, round, reactive to light.  He has purulent drainage matting eyelashes on both sides.  No  conjunctivitis.  He has slightly dry mucous membranes.  Oropharynx is  without erythema or exudate  NECK:  Supple.  No JVD.  No thyromegaly or lymphadenopathy.  LUNGS: He has occasional rhonchi; no wheezes or rales.  CARDIOVASCULAR:  Regular rate and rhythm; without murmurs, gallops or rubs.  ABDOMEN:  Normal bowel sounds; soft, nontender, nondistended.  GU/RECTAL:  Deferred.  EXTREMITIES: 2+ edema on the right; 1+ on the left.  NEUROLOGIC:  The patient has stone facies.  He has very slow movements.  Slight cogwheel rigidity.  Moving all extremities.  SKIN:  He has no rash.  He does have a scar over the dorsal aspect of his right foot, without any  drainage.  PSYCHIATRIC:  The patient is calm and cooperative.   LABS:  Complete metabolic panel significant for an albumin of 3.0,  otherwise  unremarkable.  CBC significant for hemoglobin of 11.9, otherwise  unremarkable.   CHEST X-RAY:  Shows probable right lower lobe pneumonia.   ASSESSMENT/PLAN:  1. RIGHT LOWER LOBE PNEUMONIA:  Given his history of previous aspiration      of thin liquids, I am concerned about aspiration pneumonia.  I will      cover with Zosyn and get a speech evaluation.  2. LEG SWELLING:  This may be from his hypoalbuminemia.  If anything, he      looks intravascularly volume-depleted and he has not been eating well.      I will hold off on any Lasix; DVT has been ruled out with a Doppler      last week.  3. PURULENT DRAINAGE FROM BOTH EYES.  I will give Ocuflox for now,      although he has no definite signs of conjunctivitis.  4. PARKINSON'S DISEASE.  5. STAGE IV LUNG CANCER.      Corinna L. Lendell Caprice, MD  Electronically Signed     CLS/MEDQ  D:  12/18/2005  T:  12/20/2005  Job:  [id number]

## 2010-07-25 NOTE — Op Note (Signed)
NAME:  Christian Scott, Christian Scott               ACCOUNT NO.:  [id number]   MEDICAL RECORD NO.:  [id number]          PATIENT TYPE:  INP   LOCATION:  1516                         [facility]ACILITY:  [facility]   PHYSICIAN:  Ollen Gross, M.D.    DATE OF BIRTH:  [birth year]   DATE OF PROCEDURE:  10/19/2005  DATE OF DISCHARGE:                                 OPERATIVE REPORT   PREOPERATIVE DIAGNOSIS:  Osteoarthritis right knee.   POSTOPERATIVE DIAGNOSIS:  Osteoarthritis right knee.   PROCEDURE:  Right total knee arthroplasty.   SURGEON:  Ollen Gross, M.D.   ASSISTANT:  Druscilla Brownie. Idolina Primer, P.A.-C   ANESTHESIA:  General with postop Marcaine pain pump.   ESTIMATED BLOOD LOSS:  Minimal   DRAIN:  Hemovac x1.   TOURNIQUET TIME:  42 minutes at 300 mmHg.   COMPLICATIONS:  None.   DISPOSITION:  Condition stable to recovery.   BRIEF CLINICAL NOTE:  Christian Scott is an 77 year old male with end-stage  arthritis of the right hip with intractable pain.  He has had extensive  nonoperative management including multiple series of injections.  He  presents, now, for right total knee arthroplasty.   PROCEDURE IN DETAIL:  With the successful administration of general  anesthetic a tourniquet was placed high on the right thigh, and the right  lower extremity prepped and draped in the usual sterile fashion.  Extremity  was wrapped in Esmarch, knee flexed, tourniquet inflated to 300 mmHg.  A  midline incision made with a 10-blade through the subcutaneous tissue to the  level of the extensor mechanism.  A fresh blade was used to make a medial  parapatellar arthrotomy.  Soft tissue of the proximal medial tibia is  subperiosteally elevated through the joint line with a knife and into the  semimembranosus bursa with the Cobb elevator.  Soft tissue laterally is also  elevated with attention being paid to avoid the patellar tendon on the  tibial tubercle.  Patella is everted, and the knee flexed 90 degrees, ACL  and PCL  removed.  A drill was used to create a starting hole in the distal  femur; and the canal was thoroughly irrigated.  Then a 5-degree right valgus  alignment guide is placed and referencing off the posterior condyle; its  rotation is marked and the block pinned to remove 10 mm off the distal  femur.  Distal femoral resection is made with an oscillating saw.  Sizing  block is placed, size 5 is most appropriate.  Rotation is marked off the  epicondylar axis.  Size 5 cutting block is placed; and then the anterior,  posterior, chamfer cuts are made.   Tibia subluxed forward and the menisci are removed.  Extramedullary tibial  alignment guide is placed referencing proximally at the medial aspect of the  tibial tubercle and distally along the second metatarsal axis and tibial  crest.  Blocks pinned to remove 10 mm of the nondeficient lateral side.  Tibial resection is made with an oscillating saw.  Size 5 is the most  appropriate tibial component and the proximal tibia is prepared with the  modular drill and keel punch for a size 5.  Femoral preparation is completed  with intercondylar cut.   Size 5 mobile bearing tibial trial, with a size 5 posterior stabilized  femoral trial and a 10-mm posterior stabilized rotating platform insert  trial were placed.  With the 10 full extension is achieved with excellent  varus valgus balance throughout full range of motion.  The patella was then  everted and thickness measured to be 27 mm.  Freehand resection was taken to  15 mm, 41 templates placed, lug holes were drilled, trial patella is placed  and it tracks normally.  Osteophytes were removed off the posterior femur  with the trial in place.  All trials were removed; and the cut bone surfaces  are prepared with pulsatile lavage.  Cement is mixed; and once ready for  implantation a size 5 mobile bearing tibial tray, size 5 posterior  stabilized femur and 41 patella are cemented into place and the patella  is  held with the clamp.  A trial 10-mm insert is placed, and the knee held in  full extension, and all extruded cement removed.   Once the cement is fully hardened; then a permanent 10-mm posterior  stabilized rotating platform insert is placed into the tibial tray.  The  wound is copiously irrigated with saline solution; and then the extensor  mechanism closed over the Hemovac drain with interrupted #1 PDS.  Flexion  against gravity is 135 degrees.  Subcu is closed with interrupted 2-0 Vicryl  and subcuticular running 4-0 Monocryl.  The catheter for the Marcaine pain  pump is placed; and the pump is initiated.  The tourniquet had been released  for a total time of 42 minutes.  The drain was hooked to suction.  Steri-  Strips and a bulky sterile dressing were applied and he was placed into a  knee immobilizer, awakened, and transported to recovery in stable condition.      Ollen Gross, M.D.  Electronically Signed     FA/MEDQ  D:  10/19/2005  T:  10/19/2005  Job:  [id number]

## 2010-07-25 NOTE — Op Note (Signed)
NAME:  SHAKEEL, DISNEY               ACCOUNT NO.:  [id number]   MEDICAL RECORD NO.:  [id number]          PATIENT TYPE:  OIB   LOCATION:  2899                         FACILITY:  [facility]   PHYSICIAN:  Ines Bloomer, M.D. DATE OF BIRTH:  03/14/1920   DATE OF PROCEDURE:  06/10/2004  DATE OF DISCHARGE:                                 OPERATIVE REPORT   PREOPERATIVE DIAGNOSIS:  Right lower lobe mass, left lower lobe mass.   OPERATION PERFORMED:  Video bronchoscopy.   SURGEON:  Ines Bloomer, M.D.   ANESTHESIA:  MAC with Xylocaine, Cetacaine and IV sedation.   DESCRIPTION OF PROCEDURE:  After local anesthesia with Cetacaine, Xylocaine  and IV sedation, the video bronchoscope was passed into the trachea.  The  cords were normal.  The trachea was normal.  The carina was in the midline.  The left mainstem, left upper lobe and left lower lobe orifices were normal.  The right main stem, right upper lobe orifices were normal.  The right  middle lobe orifice was normal but in the subsegment of the right lower  lobe, you could see some obstruction and under fluoroscopy, biopsies were  done of this area as well as brushings and washings.  The patient tolerated  the procedure well and was returned to the recovery room in stable  condition.      DPB/MEDQ  D:  06/10/2004  T:  06/10/2004  Job:  [id number]

## 2010-07-25 NOTE — Discharge Summary (Signed)
NAME:  Christian Scott, Christian Scott                         ACCOUNT NO.:  [id number]   MEDICAL RECORD NO.:  [id number]                   PATIENT TYPE:  INP   LOCATION:                                       FACILITY:  [facility]   PHYSICIAN:  Deirdre Peer. Polite, M.D.              DATE OF BIRTH:  06/01/1920   DATE OF ADMISSION:  04/14/2002  DATE OF DISCHARGE:  04/22/2002                                 DISCHARGE SUMMARY   PRIMARY CARE PHYSICIAN:  Miguel Aschoff, M.D.   DISCHARGE DIAGNOSES:  1. Left lower leg cellulitis.  2. Tinea pedis.  3. Drug reaction.  4. Parkinson's disease.   DISCHARGE MEDICATIONS:  1. Protonix 40 mg daily.  2. Lasix 20 mg daily.  3. K-Dur 20 mEq daily.  4. Nizoral 2% cream to left toe twice daily.  5. Tequin 400 mg daily for four more days.  6. Requip 30 mg daily.  7. Darvocet-N 100 one to two tablets every six hours as needed for pain.   CONSULTANTS:  None.   PROCEDURE:  1. Bilateral lower extremity Dopplers 04/19/02:  No DVT, SVT, or Baker's cyst     bilaterally. There is an enlarged lymph node visualized in the left     inguinal crease. Edema is noted in the left calf. No change since study     of 04/14/02.  2. Two-dimensional echocardiogram 04/17/02:  LV systolic function, lower     limits of normal, LV EF 50 to 55%, aortic valve thickness mildly     increased, and there was mild mitral valvular regurgitation.  3. Twelve lead EKG 04/14/02:  Normal sinus rhythm.   LABORATORY DATA:  Sodium 134, potassium 3.8, chloride 102, CO2 22, glucose  104, BUN 12, creatinine 1.2, calcium 7.7. WBC 6.5, RBC 3.5, hemoglobin 11.8,  hematocrit 34.2. TSH 1.373. Urinalysis negative.   DISPOSITION:  The patient will be discharged home.   CONDITION ON DISCHARGE:  Stable.   HISTORY OF PRESENT ILLNESS:  This is a 77 year old man who presented to  [facility] on 04/14/02 with left leg pain, redness, and increased lower  extremity edema over the past two to three days. The patient had been seen  at  Dr. Marya Landry. Kingsley's office and was sent to [facility] for a direct  admission because of the concern of his ascending cellulitis. The patient  has a history of chronic renal insufficiency with bilateral ankle edema  which has been evaluated by his primary care doctor and orthopedist. Venous  Dopplers were negative. 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 On initial  evaluation of his left lower leg with 3 to 4+ edema and erythema. The  patient was admitted for further evaluation and treatment.   HOSPITAL COURSE:  Problem 1. Cellulitis of his left lower leg. The patient  was  admitted, started on IV antibiotics. Bilateral lower extremity  ultrasounds were performed on 2/6 and again on 2/11. Two-D echocardiogram  and EKG were done to assess his LV function with results noted as above. Due  to his history of chronic venous insufficiency, he was started on a  diuretic. His leg did improve. At discharge, he has approximately 2+ edema.  His erythema has almost completely resolved. He does have some chronic  venous stasis changes of his left lower extremity. The patient is able to  ambulate. He is being discharged on an additional four days of Tequin along  with Lasix and K-Dur.   Problem 2. Tinea pedis. The patient had some fungus noted between his left  third and fourth toe. This was felt to be possibly the site of inoculation.  He was provided with Lamisil cream twice a day. He is to continue this at  home until it is healed.   Problem 3. Drug reaction. Initially, the patient was on IV Ancef. After  approximately 48 hours, he developed a macular papular rash on his back. His  antibiotic therapy was switched. He was provided with hydrocortisone cream.  His rash is resolving. The patient never had any difficulties with  breathing.   Problem 4.  Parkinson's disease. Stable. Continue outpatient medications.    Problem 5. Nausea and vomiting. The patient had a couple episodes of nausea  and vomiting prior to discharge. He was adamant about going home. Protonix  was added to his medication regimen.   DISCHARGE INSTRUCTIONS AND FOLLOWUP:  The patient should follow up with his  primary care physician in approximately one week. The patient was put on the  [facility] trial and was provided instructions and medications by the study  coordinator.     Stephanie Swaziland, NP                      Deirdre Peer. Polite, M.D.    SJ/MEDQ  D:  04/22/2002  T:  04/22/2002  Job:  [id number]   cc:   Miguel Aschoff, M.D.  [address], [address]  [place]  [place]  [phone]  Fax: [phone]

## 2010-07-25 NOTE — Op Note (Signed)
NAME:  Christian, Scott               ACCOUNT NO.:  [id number]   MEDICAL RECORD NO.:  [id number]          PATIENT TYPE:  INP   LOCATION:  0001                         FACILITY:  [facility]   PHYSICIAN:  Ollen Gross, M.D.    DATE OF BIRTH:  06-21-1920   DATE OF PROCEDURE:  01/29/2006  DATE OF DISCHARGE:                               OPERATIVE REPORT   PREOPERATIVE DIAGNOSIS:  Osteoarthritis, left hip.   POSTOPERATIVE DIAGNOSIS:  Osteoarthritis, left hip.   PROCEDURE:  Left total hip arthroplasty.   SURGEON:  Ollen Gross, M.D.   ASSISTANT:  Avel Peace.   ANESTHESIA:  Spinal.   ESTIMATED BLOOD LOSS:  200.   DRAINS:  Hemovac times one.   COMPLICATIONS:  None.   CONDITION:  Stable to recovery.   BRIEF CLINICAL NOTE:  [name] is a 77 year old male with severe end-  stage erosive arthritis of his left hip with intractable pain.  He  presents now for left total hip arthroplasty.   PROCEDURE IN DETAIL:  After successful administration of spinal  anesthetic, the patient is placed in the right lateral decubitus  position with the left side up and held with the hip positioner.  Left  lower extremity was isolated from his perineum with plastic drapes and  prepped and draped in the usual sterile fashion.  Short posterolateral  incision was made with 10 blade through subcutaneous tissue to the level  of the fascia lata which was incised in line with the skin incision.  Sciatic nerve is palpated and protected and the short rotators isolated  off the femur.  Capsulectomy was performed and the hip was dislocated.  A trial prosthesis was placed such that the center of the trial head  corresponds to the center of his native femoral head.  Osteotomy lines  marked on the femoral neck and osteotomy made with an oscillating saw.  Femoral head removed and the femur retracted anteriorly to gain  acetabular exposure.  The acetabular retractors were then placed and  labrum and osteophytes  removed.  Reaming starts at 45 mm coursing in  increments of 2 up to 55 mm and then a 56 mm pinnacle acetabular shell  was placed in anatomic position and transfixed with two dome screws.  Trial 36 mm neutral +4 liner was placed.   The femur is prepared the canal finder and irrigation.  Axial reaming is  performed to 15.5 mm, proximal reaming to a 20 F.  The sleeve machined  to a large.  20 F large sleeve is placed with a 20 x 15 stem.  A 36 plus  8 neck about 10 degrees beyond his native anteversion.  Trial 36 plus 0  head is placed.  He did not have enough offset, so we had to go with 36  plus 12 neck.  We went to 36 plus 6 head to gain our best soft tissue  tension.  Full extension, full external rotation was achieved with 70  degrees flexion, 40 degrees adduction and 90 degrees internal rotation  and 90 degrees of flexion, 70 degrees internal rotation.  By placing  the  left leg on top of the right it felt as though leg lengths were equal.   All trials were removed and the permanent apex hole eliminator was  placed into the acetabular shell.  Permanent 36 mm neutral +4 marathon  liner was placed.  The femoral side 20 F large sleeve with 20 x 15 stem,  36 plus 12 neck 10 degrees beyond native anteversion.  36 plus 6 head is  placed and the hip is reduced same stability parameters.  Wounds  copiously irrigated with saline solution and short rotators reattached  to the femur through drill holes.  Fascia lata was closed over Hemovac  drain with interrupted #1 Vicryl, subcu closed #1-0 and #2-0 Vicryl and  subcuticular running 4-0 Monocryl.  Drains hooked to suction.  Incision  cleaned and dried and Steri-Strips and bulky sterile dressing applied.  He is then placed into a knee immobilizer, awakened, transported to  recovery in stable condition.      Ollen Gross, M.D.  Electronically Signed     FA/MEDQ  D:  01/29/2006  T:  01/29/2006  Job:  [id number]

## 2010-07-25 NOTE — H&P (Signed)
NAME:  Christian Scott, BRING NO.:  [id number]   MEDICAL RECORD NO.:  [id number]           PATIENT TYPE:   LOCATION:                                 FACILITY:   PHYSICIAN:  Ollen Gross, M.D.    DATE OF BIRTH:  08-11-1920   DATE OF ADMISSION:  05/26/2004  DATE OF DISCHARGE:                                HISTORY & PHYSICAL   CHIEF COMPLAINT:  Christian Scott is an 77 year old male with a chief complaint  of right knee pain.   HISTORY OF PRESENT ILLNESS:  The patient has had right knee pain off an on  for about a year.  It has been refractory to conservative treatment such as  cortisone injections.  The patient has elected to have a total knee  arthroplasty of the right knee by Dr. Ollen Gross on May 26, 2004.   ALLERGIES:  NO KNOWN DRUG ALLERGIES.   MEDICATIONS:  1.  Mobic 7.5 mg daily.  2.  Lodosyn one pill t.i.d.  The patient is unsure of the dosage.  3.  Parcopa one pill t.i.d.  The patient is unsure of the dosage.   PAST MEDICAL HISTORY:  Cerebrovascular accident.   FAMILY HISTORY:  Coronary artery disease and diabetes.   SOCIAL HISTORY:  The patient is married.  He is self-employed.  He does have  daily alcohol use of one drink a day.  No smoking.   REVIEW OF SYSTEMS:  The patient has pain with ambulation, with the right  knee greater than the left knee.  He also has some clear rhinorrhea that  began yesterday.   PHYSICAL EXAMINATION:  GENERAL:  The patient is an 77 year old healthy-  appearing male who is somewhat hard of hearing but otherwise in good health,  with a pleasant mood and affect, alert and oriented x3.  VITAL SIGNS:  Pulse 80, respirations 18, blood pressure 118/58.  NEUROLOGIC:  Cranial nerves II-XII are intact.  BACK:  The patient has full range of motion of the cervical spine with no  tenderness to cervical, thoracic, or lumbar spines and no active muscle  spasm.  EXTREMITIES:  Examination of the bilateral upper extremities shows full   range of motion, sensation grossly intact.  Strength of the biceps, triceps,  and grip strength is 5/5 bilaterally.  Mild tenderness and crepitus with  range of motion of both knees, right greater than left.  Neurovascularly, he  is intact distally.  He does have mild pedal edema bilaterally, but pulses  are 2+ and equal bilaterally.  Deep tendon reflexes are 2+ and equal  bilaterally.  CHEST:  Active breath sounds bilaterally with no wheezing, rhonchi, or  rales.  HEART:  Regular rate with the detection of skipping a beat about every third  to fourth beat.  ABDOMEN:  Nontender, nondistended.  SKIN:  Mild irritation and dry skin to bilateral calfs and lower  extremities.  No signs of cellulitis currently.   LABORATORY DATA:  X-rays show bilateral knee osteoarthritis.   IMPRESSION:  Right knee pain secondary to osteoarthritis.   PLAN:  Right total knee arthroplasty  by Dr. Ollen Gross on May 26, 2004.      TBD/MEDQ  D:  05/09/2004  T:  05/09/2004  Job:  [id number]

## 2010-07-25 NOTE — Discharge Summary (Signed)
NAME:  Christian Scott, Christian Scott               ACCOUNT NO.:  [id number]   MEDICAL RECORD NO.:  [id number]          PATIENT TYPE:  IPS   LOCATION:  4147                         FACILITY:  [facility]   PHYSICIAN:  Ellwood Dense, M.D.   DATE OF BIRTH:  Jan 09, 1921   DATE OF ADMISSION:  DATE OF DISCHARGE:  02/09/2006                               DISCHARGE SUMMARY   DISCHARGE DIAGNOSES:  1. Left knee OA requiring total knee replacement.  2. Neutropenia, stable.  3. Acute blood loss anemia.  4. Parkinson's disease.  5. Hypokalemia, resolved.  6. Left total hip replacement.   HISTORY OF PRESENT ILLNESS:  Christian Scott is an 77 year old male with  history of Parkinson's disease, stage 4 non-small cell lung cancer, end  stage left hip requiring left total hip replacement on January 29, 2006  by Dr. Sherlean Foot.  The patient was placed on Coumadin for DVT prophylaxis.  INR subtherapeutic at 1.7 today.  He is currently weight bearing as  tolerated and has been participating in therapy.  Hospital course  complicated by acute blood loss anemia requiring 3 units of packed red  blood cells as well as issues of decreased urine output and  hypertension.  Foley currently remains in place.  PO intake reported to  be poor.  Rehab consulted for further therapy.   PAST MEDICAL HISTORY:  Significant for Parkinson's disease.  Reports of  chronic dysphagia.  History of aspiration pneumonia x2 this year.  Diverticulosis.  Excision of bilateral cataracts.  Hernia repair.  Right  total knee replacement, insomnia, history of left lower extremity  cellulitis, erectile dysfunction, CVA with right upper extremity  weakness, hemorrhoidectomy, dry eye syndrome and lung cancer diagnosed  in 2005.   ALLERGIES:  No known drug allergies.   FAMILY HISTORY:  Positive for coronary artery disease.   SOCIAL HISTORY:  The patient is married and lives in a one level room  with no steps for entry.  Does not use any tobacco.  Uses alcohol  rarely.  The patient was ambulating with cane prior to admission.   HOSPITAL COURSE:  Christian Scott was admitted to rehab on February 02, 2006 for inpatient therapies to consider PT, OT.  Past admissions of  __________ Coumadin subtherapeutic __________.  The patient was noted to  have some issues of sedation as well as disorientation at time of  admission.  Therefore, Vicodin was discontinued and Ultram was used on a  p.r.n. basis for pain control.  The patient was continued on supplements  for acute blood loss anemia.  CBC was checked during this stay showing  H&H of 9.2 and 26.9.  The patient's white count was noted to be low at  2.9.  This has been followed along with serial checks.  Last check  previously from December 03 shows hemoglobin 9.4 and hematocrit 27.4,  white count 2.9, platelets 262.  Check of electrolytes initially showed  hypokalemia with potassium of 3.2.  His hypokalemia was supplemented.  The patient was also noted to develop some left lower extremity edema  secondary to recent surgery.  He was treated  with p.o. doses of Lasix  times two as well as advised to use knee high TEDS and elevation to help  with edema control.  Recheck electrolytes last on December 03 revealed  sodium 139, potassium 4.0, chloride 106, CO2 27, BUN 11, creatinine 0.8,  glucose 80.  The patient's left hip wound has been monitored along.  This is noted to be intact.  He has been doing well without any signs or  symptoms of infection.  The patient's p.o. intake has improved.  He does  continue on Ensure supplements.  No reports of dysphagia during this  stay.   The patient's wife has been very supportive and family __________was  completed without difficulty.  The patient has participated in therapy  and has progressed along well.  At time of discharge the patient was at  supervision level for ADLs, requiring occasional cues to maintain hip  precautions.  The patient has modified  independence for sitting balance.  Modified independence for standing balance.  Requires some supervision  for dynamic standing balance for maintaining his hip precautions.  He  was able to ambulate short distances with a walker.  On February 09, 2006  the patient is discharged to home.   DISCHARGE MEDICATIONS:  1. Nu-iron 150 mg b.i.d.  2. Coumadin 2 mg 1-1/2 p.o. q. p.m.  to continue until February 28, 2006.  3. Tarceva 156 mg per day.  4. Lodosyn 25 mg t.i.d.  5. Azilect 1 per day.  6. Sinemet 25-100 mg t.i.d.  7. Claritin 10 mg per day.  8. Ensure supplement 45 mL 2-3 times per day.   Diet is regular with restrictions for Azilect.  Wound care wash with  antibacterial soap and water.  Keep clean and dry. Activity level is  with immediate supervision, ambulate with walker.  No alcohol, no  smoking.  Follow left total hip precautions.   FOLLOWUP:  The patient will followup with Dr. Sherlean Foot for postoperative  check in two weeks.  Followup with Dr. Tresa Endo on February 16, 2006 at  10:20.  Followup with Dr. Thomasena Edis as needed.      Greg Cutter, P.A.    ______________________________  Ellwood Dense, M.D.    PP/MEDQ  D:  02/09/2006  T:  02/10/2006  Job:  [id number]   cc:   Tennis Must. Sherlean Foot, M.D.  Lajuana Matte, MD

## 2010-07-25 NOTE — H&P (Signed)
NAME:  Christian Scott, Christian Scott               ACCOUNT NO.:  [id number]   MEDICAL RECORD NO.:  [id number]          PATIENT TYPE:  IPS   LOCATION:  4007                         FACILITY:  [facility]   PHYSICIAN:  Erick Colace, M.D.DATE OF BIRTH:  May 06, 1920   DATE OF ADMISSION:  10/23/2005  DATE OF DISCHARGE:                                HISTORY & PHYSICAL   REASON FOR ADMISSION:  Declining self-care and mobility following right TKR  with postoperative complications.   An 77 year old male with stage IV nonsmall-cell lung CA diagnosed in March  2006, followed by Dr. Arbutus Ped.  He also has a past medical history of CVA  and Parkinson's disease.  He was admitted on October 19, 2005, to [facility] with end-stage changes of the right knee.  He had no relief with  conservative care.  He underwent right TKA October 19, 2005, per Dr. Lequita Halt.  Placed on Coumadin for DVT prophylaxis and made weightbearing as tolerated.  Postoperative anemia 7.8, transfused 2 units PRBCs.  His hemoglobin was up  to 9.4 on August 15.  On August 14 he had a cough and chest congestion.  Chest x-ray showed a superimposed bilateral lower lobe infiltrate upon a  right lower lobe lung lesion.  He was placed on IV Zosyn q.6h. on August 15.  Follow-up chest x-ray on August 16 showed improved aeration but persistent  bibasilar air space disease.  Modified barium swallow showed dysphagia and  he was felt to be safe for nectar liquids, D-3 diet, crushing all  medications.  IV infiltrated and Zosyn discontinued today.  Following up a  chest x-ray Monday has been planned.   REVIEW OF SYSTEMS:  Positive for cough, reflux, tremors, joint swelling and  weakness.   PAST MEDICAL HISTORY:  1. Parkinson's disease as noted, followed by Dr. Orlin Hilding.  2. Diverticulosis, followed by [facility].  3. Left lower extremity cellulitis by history.  4. Hemorrhoid surgery.  5. Bilateral cataract surgery.   HABITS:  Negative ETOH,  negative tobacco.   FAMILY HISTORY:  Positive for CAD, positive for cancer.   SOCIAL HISTORY:  Married.  Wife can assist with lifting.  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  A 1-level home, 2 steps to entry.  Wife with history of total knee  replacement 3 years ago.   FUNCTIONAL HISTORY:  Independent walker, sedentary.   FUNCTIONAL STATUS:  He currently needs assistance with ADLs and mobility  using a 2-person assist to get out of bed.   MEDICATIONS:  1. Parcopa 25 mg b.i.d.  2. Tarceva 150 mg daily.  3. Temazepam 15 mg q.h.s.  4. Lodosyn 25 mg b.i.d.  5. Multivitamin daily.  6. Azilect 1 mg per day, it is an MAOB inhibitor.   CURRENT MEDICATIONS:  1. Coumadin per pharmacy protocol.  2. Senna S two q.h.s.  3. Tarceva 150 mg daily.  4. Trinsicon one p.o. b.i.d.  5. Azilect 1 mg daily (home medication).  6. Lodosyn 25 mg twice a day (home medication).  7. Temazepam 15 mg q.h.s. p.r.n.  8. Protonix 40 mg p.o. daily.   PHYSICAL  EXAMINATION:  VITAL SIGNS:  Blood pressure 144/71, pulse 82,  temperature 97, respirations 20, O2 saturation 95% on room air.  GENERAL:  An elderly male with mask facies, in no acute distress.  Movements  are bradykinetic.  HEENT:  Eyes anicteric, not injected.  External ET normal.  NECK:  Supple without adenopathy.  RESPIRATORY:  Respiratory effort is good.  Lungs have decreased breath  sounds at the bases.  CARDIAC:  Regular rate and rhythm, no murmurs, rubs or adventitious sounds.  ABDOMEN:  Positive bowel sounds, soft, nontender to palpation.  EXTREMITIES:  No clubbing, cyanosis, or edema with the exception of the left  upper extremity from the infiltration, mainly concentrated at the elbow and  upper arm area.  Right knee shows Steri-Strips intact, mild effusion at the  knee, no erythema.  Mild greenish ecchymosis.  NEUROLOGIC:  Positive cogwheeling on the right arm greater than the left.  Motor strength is graded as 4-/5 strength bilateral upper extremities, 1  in  the hip flexor quad, 2- in the TA and gastroc on the right, and 2+/5 on the  left side.   IMPRESSION:  1. Decline in self-care and mobility.  Right total knee replacement      secondary to degenerative joint disease, postoperative day #4.  His      multiple comorbidities including stage IV nonsmall-cell lung carcinoma      as well as Parkinson's disease have limited his recovery.  Will start      on comprehensive inpatient rehabilitation with speech for      communication, physical therapy and occupational therapy for mobility.      Twenty-four hour rehab nursing to monitor for mental status, monitor      for aspiration, monitor his multiple medications.  Twenty-four rehab MD      to monitor medications as well as aspiration pneumonia.  2. Postoperative anemia.  Follow up CBC.  3. Aspiration pneumonia.  Follow chest x-ray.  4. Parkinson's disease.  Continue Lodosyn and Parcopa.  5. History of cerebrovascular accident.  Currently on Coumadin.  Question      if he takes aspirin at home.   Estimated length of stay is 14 days.   Prognosis for functional recovery is fair given overall debility and  multiple issues.  Goal is for minimum assist to supervision with ADLs,  mobility.      Erick Colace, M.D.  Electronically Signed     AEK/MEDQ  D:  10/23/2005  T:  10/23/2005  Job:  [id number]   cc:   Santina Evans A. Orlin Hilding, M.D.  Dr. Georgena Spurling, M.D.  Lajuana Matte, MD

## 2010-07-25 NOTE — Consult Note (Signed)
NAME:  Christian Scott, Christian Scott               ACCOUNT NO.:  [id number]   MEDICAL RECORD NO.:  [id number]          PATIENT TYPE:  INP   LOCATION:  0379                         [facility]ACILITY[facility]  [facility]   PHYSICIAN:  Iva Boop, M.D. LHCDATE OF BIRTH:  19-Feb-1921   DATE OF CONSULTATION:  DATE OF DISCHARGE:                                   CONSULTATION   REQUESTING PHYSICIAN:  Dr. Jamison Oka.   PRIMARY CARE PHYSICIAN:  Dr. Jenita Seashore.   ONCOLOGIST:  Dr. Si Gaul.   REASON FOR CONSULTATION:  Rectal bleeding.   HISTORY:  This is a pleasant 77 year old white man, recently diagnosed with  lung cancer, who became constipated and had an impaction relieved in the ER  on May 4.  Subsequent to that, he developed some bright red blood per rectum  a couple of times and has been having some dripping blood since then.  Since  then, he has had some very small clots and mucus/blood.  On rectal exam  today by my physician assistant, he had some clear mucus with blood  staining.  No mass.  No persistent impaction.  He is not complaining of  rectal pain.  He had a colonoscopy performed and apparently had some  bleeding prior to that.  This was performed April 04, 2003, by Dr. Melvia Heaps.  He had diverticulosis in the transverse, descending, and sigmoid  colon.  It was otherwise reported to be normal.  No hemorrhoids were  detected.  He has not had a normal bowel movement since admission.  He is  not complaining of abdominal pain or cramping.   MEDICATIONS:  1.  GlycoLax daily.  2.  Oxycodone p.r.n.  3.  Lodosyn two tablets three times a day.  4.  Parcopa three times a day.   DRUG ALLERGIES:  None known.   PAST MEDICAL HISTORY:  1.  Parkinson's disease.  2.  Osteoarthritis.  3.  Remote hemorrhoidectomy 50 years ago.  4.  Lung cancer.  5.  Prior stroke in 2001.   FAMILY HISTORY:  Diabetes mellitus and coronary artery disease are in the  family.   SOCIAL HISTORY:  He is married.  He  lives with his wife.  He is fairly  independent.  No smoking or alcohol.   REVIEW OF SYSTEMS:  No chest pain.  No respiratory difficulty or shortness  of breath.  He did have some difficulty urinating when he had his impaction,  but that has improved.  Note:  He is currently undergoing chemotherapy and  radiotherapy for his lung cancer.  He has not been using any nonsteroidals  or aspirin.  He is on no antiplatelet or anticoagulants otherwise.   PHYSICAL EXAMINATION:  GENERAL:  Well-developed, obese, elderly white man in  no acute distress.  He is alert and oriented x3.  He has slow movements and  mask facies consistent with Parkinson's disease.  VITAL SIGNS:  Temperature 98, blood pressure 149/73, and pulse 74.  HEENT:  Eyes anicteric.  LUNGS:  Clear.  HEART:  S1 and S2.  No murmurs, rubs, or gallops.  ABDOMEN:  Soft.  Bowel sounds are present.  It is obese and nontender with  no organomegaly or mass.  RECTAL:  As described in the HPI.   LABORATORY DATA:  Hemoglobin 11.1, down to 9.1 and 9.3.  His hemoglobin was  11.3 on April 11.  White count 6.9 and platelets 293.  Coags normal.  BUN 20  and creatinine 0.8.   ASSESSMENT:  An 77 year old white man with rectal bleeding  problems/hematochezia.  Stercoral ulcer, trauma from his disimpaction,  internal hemorrhoids, and diverticulosis are in the differential.  It seems  unlikely that a polyp or cancer would be present given his recent negative  colonoscopy, but that is possible.   He does have this anemia, perhaps from this bleeding.  It did not sound like  that was that large of a volume, but it is possible plus/minus some  hemoconcentration issues.  He has concomitant lung cancer, undergoing  treatment; Parkinson's disease; and prior stroke.   PLAN:  1.  Flexible sigmoidoscopy to investigate this tomorrow.  I have explained      the risks, benefits, and indications.  He understands that one of my      partners will be performing  the exam.  2.  Full-liquid diet for the time being.  3.  Serial hemoglobins.  He may need transfusion.  4.  Empiric Anusol suppositories with hydrocortisone at this point.   I appreciate the opportunity to care for this patient.      CEG/MEDQ  D:  07/13/2004  T:  07/14/2004  Job:  [id number]   cc:   Almedia Balls  [address]  [place]  [place] [address]  Fax: [phone]   Lajuana Matte, MD  Fax: [phone]   Mobolaji B. Corky Downs, M.D.

## 2010-07-25 NOTE — H&P (Signed)
NAME:  Christian Scott, Christian Scott               ACCOUNT NO.:  [id number]   MEDICAL RECORD NO.:  [id number]          PATIENT TYPE:  INP   LOCATION:  NA                           FACILITY:  [facility]   PHYSICIAN:  Ollen Gross, M.D.    DATE OF BIRTH:  Aug 06, 1920   DATE OF ADMISSION:  01/29/2006  DATE OF DISCHARGE:                                HISTORY & PHYSICAL   DATE OF OFFICE VISIT HISTORY AND PHYSICAL:  January 26, 2006   CHIEF COMPLAINT:  Left hip pain.   HISTORY OF PRESENT ILLNESS:  The patient is an 77 year old male who has been  seen by Dr. Lequita Halt for ongoing arthritis.  He had a right knee and left hip  arthritis.  He previously has undergone a right knee total arthroplasty in  the summer of this year.  He is doing well with that.  Unfortunately, the  left hip has continued to progress in nature.  He has reached a point where  he would like to have something done about it.  Risks and benefits of the  procedure have been discussed with the patient.  He would like to proceed  with surgery.   ALLERGIES:  No known drug allergies.   CURRENT MEDICATIONS:  Lodosyn, __________, temazepam, Aleve, multivitamins,  Tarceva.   PAST MEDICAL HISTORY:  Stage 4 non-small-cell lung cancer, hemorrhoids,  diverticulosis, erectile dysfunction, Parkinson's, left leg cellulitis,  history of aspiration pneumonia, previous history of acute blood loss  anemia, previous history of transfusion, also a previous stroke.   PAST SURGICAL HISTORY:  Bilateral cataract surgery, hemorrhoid surgery and  right total knee.   SOCIAL HISTORY:  Married, retired, occasional intake of alcohol, nonsmoker.   FAMILY HISTORY:  Mother with history of stroke.   REVIEW OF SYSTEMS:  GENERAL:  No fevers, chills or night sweats.  NEUROLOGIC:  Previous stroke.  No seizures or syncope.  RESPIRATORY:  No  shortness of breath, productive cough or hemoptysis.  CARDIOVASCULAR:  __________ chest pain, orthopnea.  GI:  No nausea, vomiting,  diarrhea,  constipation.  GU:  No dysuria, hematuria, discharge.  MUSCULOSKELETAL:  Left hip.   PHYSICAL EXAMINATION:  VITAL SIGNS:  Pulse 76, respirations 12, blood  pressure 118/54.  GENERAL:  An 77 year old, small, thin-framed, frail white male in no acute  distress, flat affect, accompanied by his wife.  HEENT:  Normocephalic, atraumatic.  Pupils round, reactive.  Oropharynx  clear.  EOMs intact.  NECK:  Supple.  CHEST:  Clear anterior-posterior chest walls.  No rhonchi, rales or  wheezing.  HEART:  Regular rhythm.  No murmur.  ABDOMEN:  Soft, nontender, bowel sounds present.  RECTAL/BREASTS/GENITALIA:  Not done, not pertinent to present illness.  EXTREMITIES/LEFT HIP:  Left hip shows marked limited motion, painful.  Motor  function is intact.  Does ambulate with an antalgic shuffled gait.   IMPRESSION:  1. Osteoarthritis, left hip.  2. Stage 4 non-small-cell lung cancer.  3. Hemorrhoids.  4. Diverticulosis.  5. Erectile dysfunction.  6. History of osteoarthritis.  7. Parkinson's.  8. History of left leg cellulitis.  9. History of aspiration pneumonia.  10.History of acute blood loss anemia.  11.History of transfusion in past.  12.History of stroke.   PLAN:  The patient was admitted to [facility] to undergo left  total hip arthroplasty.  Surgery will be performed by Dr. Ollen Gross.      Alexzandrew L. Julien Girt, P.A.      Ollen Gross, M.D.  Electronically Signed    ALP/MEDQ  D:  01/28/2006  T:  01/28/2006  Job:  [id number]   cc:   Lajuana Matte, MD  Fax: [phone]   Ollen Gross, M.D.  Fax: [phone]   Almedia Balls  Fax: [phone]   Charmayne Sheer, MD  [place], [place]

## 2010-07-25 NOTE — Discharge Summary (Signed)
NAMEJASIYAH, Christian Scott               ACCOUNT NO.:  [id number]   MEDICAL RECORD NO.:  [id number]          PATIENT TYPE:  INP   LOCATION:  1510                         FACILITY:  [facility]   PHYSICIAN:  Ollen Gross, M.D.    DATE OF BIRTH:  1920/06/12   DATE OF ADMISSION:  01/29/2006  DATE OF DISCHARGE:  02/02/2006                               DISCHARGE SUMMARY   ADMISSION DIAGNOSES:  1. Osteoarthritis, left hip.  2. Stage IV nonsmall cell lung cancer.  3. Hemorrhoids.  4. Diverticulosis.  5. Erectile dysfunction.  6. History of osteoarthritis.  7. Parkinson's.  8. History of left leg cellulitis.  9. History of aspiration pneumonia.  10.History of acute blood loss anemia.  11.History of transfusion in the past.  12.History of stroke.   DISCHARGE DIAGNOSES:  1. Osteoarthritis, left hip status post left total hip arthroplasty.  2. Acute blood loss anemia.  3. Status post transfusion without sequelae.  4. Postoperative hypotension secondary to blood loss.  5. Postoperative oliguria secondary to hypotension, improved.  6. Stage IV nonsmall cell lung cancer.  7. Hemorrhoids.  8. Diverticulosis.  9. Erectile dysfunction.  10.History of osteoarthritis.  11.Parkinson's.  12.History of left leg cellulitis.  13.History of aspiration pneumonia.  14.History of acute blood loss anemia.  15.History of transfusion in the past.  16.History of stroke.   PROCEDURE:  January 29, 2006 left total hip, surgeon Ollen Gross,  M.D.  Assistant Alexzandrew L. Perkins, PA-C.  Anesthesia spinal.   CONSULTATIONS:  1. Medical services, Hollice Espy, M.D.  2. Rehabilitation services.   BRIEF HISTORY:  Christian Scott is an 77 year old male with severe endstage  erosive arthritis of the left hip with intractable pain now presents for  total hip arthroplasty.   LABORATORY DATA:  Preoperative CBC showed a hemoglobin of 11.7,  hematocrit 34.9.  Hemoglobin was a little low on the admission.  White  count 3.5.  Postoperative hemoglobin down to 9.2 then to 8, given blood,  back up to 9.2.  Last H and H 8.6 and 24.9.  PT and PTT preoperative  14.3 and 33 respectively.  INR 1.1.  Serial protimes were followed.  Last noted PT and INR 24.4 and 1.7.  Chemistry panel on admission all  within normal limits with the exception of low albumin at 3.2.  Serial B-  METs were followed.  All electrolytes remained within normal limits.  Preoperative UA negative.  Blood type O negative.  Preoperative left hip  films:  Severe degenerative disease left hip.  No acute findings.  Portable hip and pelvis film:  Normal alignment left total hip.   HOSPITAL COURSE:  The patient was admitted to [facility],  tolerated the procedure well, later transferred from the recovery room  to the orthopaedic floor.  Started on PCA and p.o. analgesics for pain  control following surgery.  Did have a fair amount of pain through the  night and on day one.  Started getting up out of bed.  Did have some low  blood pressure issues.  Given fluids with some decreased urinary output.  Monitor  strict I and O's postoperatively.  A rehab consult was called.  The patient was seen by Rehab Services who felt that the patient would  benefit from possible inpatient stay.  They followed him through the  hospital course to monitor progress.  On day one, the patient was given  fluids because of the low urinary output.  Medical consult was called on  postoperative day one due to the hypotension.  It was felt to be  secondary to blood loss, surgery and pain medications.  Pressure did  improve and output did respond to IV fluids.  By day two, the patient  was doing a little bit better.  Dressing was changed.  Incision looked  good.  Blood pressure had improved a little bit.  Hemoglobin was still  low, so the patient was given one unit of blood.  Tolerated transfusion  well.  Started getting up with physical therapy.  By day three,   hemoglobin was still low after the unit, but he was asymptomatic at this  point.  Pressure had been stabilized.  The patient was on iron  supplements.  Continued with therapy.  By this time, the patient was  getting up and ambulating approximately 50 feet, starting to progress  well, but was not ready to go home.  Hypotension and decreased urinary  output had resolved.  The patient was on iron, asymptomatic.  On  postoperative day four the incision looked good.  Progressing well.  Bed  became available on the rehab unit.  He was transferred over for  continued care.   DISCHARGE PLAN:  1. The patient was transferred to [facility].  2. Discharge diagnoses:  Please see above.  3. Discharge medications:  Continue current medication per the [facility]      which will be sent over with the patient.  4. Diet:  Diverticular diet.  5. Activity:  Partial weightbearing 25-50% to the left lower      extremity.  Hip precautions noted by protocol.  Continue with PT      and OT for gait training, ambulation, ADLs.  6. Follow-up:  Two weeks from surgery or following the discharge from      the rehab unit.  7. Disposition:  [facility].   CONDITION ON DISCHARGE:  Improving.      Alexzandrew L. Julien Girt, P.A.      Ollen Gross, M.D.  Electronically Signed    ALP/MEDQ  D:  03/04/2006  T:  03/04/2006  Job:  [id number]   cc:   Ollen Gross, M.D.  Fax: [phone]   Lajuana Matte, MD  Fax: [phone]   Almedia Balls  Fax: [phone]   Dr. Charmayne Sheer  [facility]   Hollice Espy, M.D.

## 2010-07-25 NOTE — Consult Note (Signed)
NAMEJAVARI, BUFKIN               ACCOUNT NO.:  [id number]   MEDICAL RECORD NO.:  [id number]          PATIENT TYPE:  INP   LOCATION:  1510                         FACILITY:  [facility]   PHYSICIAN:  Hollice Espy, M.D.DATE OF BIRTH:  19-Aug-1920   DATE OF CONSULTATION:  02/02/2006  DATE OF DISCHARGE:                                   CONSULTATION   REASON FOR CONSULTATION:  Hypotension and decreased urine output.   HOSPITAL COURSE:  The patient is a very pleasant 77 year old white male with  a past medical history of stage 4 non-small cell lung cancer being actively  treated, Parkinson's disease, and CVA who was admitted on 01/29/06 for a  left total hip arthroplasty secondary to severe worsening DJD.  The patient  tolerated the procedure well postop, however he was noted on postop day 1 to  have a blood pressure of 98/46 with a normal heart rate.  Follow-up labs had  been ordered on the patient and he was found to have a hemoglobin of 9.2  when previous hemoglobin prior to surgery had been 11.7.  In addition, his  BUN and creatinine essentially from 15 and 0.9 to 12 and 1, so it is not  felt to be significantly depleted.  However, the patient's urine output had  problems with decline over the course of postop day 1.  The patient was  given several fluid boluses and when he continued to have issues, the  hospitalists were called for further evaluation.  The patient, himself, is  currently doing well.  He says he feels weak but denies any light-  headedness.  He has been in a chair and his only real complaint has been  soreness of that hip.  He denies any headaches, vision changes, dysphagia,  chest pain, palpitations, shortness of breath, wheezing coughing, abdominal  pain.  He denies any other complaints.  Review of systems is really  otherwise negative than complaints of soreness at the hip site.   PAST MEDICAL HISTORY:  1. Parkinson's disease.  2. Stage 4 non-small cell cancer  being actively treated.  3. Hemorrhoids.  4. Diverticulosis.  5. Erectile dysfunction.  6. Previous history of CVA.   MEDICATIONS:  Currently the patient is on:  1. Lodosyn p.o. t.i.d.  2. Tarceva 150 mg p.o. daily.  3. Restoril 15 mg p.o. q.h.s.  Medicines here, he is on:  1. D5 0.5 normal at 100 ml an hour.  2. Colace 100 mg p.o. b.i.d.  3. Coumadin  4. Tylenol p.r.n.  5. Benadryl p.r.n.  6. Lorcet p.r.n.  7. Robaxin p.r.n.  8. Reglan p.r.n.  9. Zofran p.r.n.   ALLERGIES:  THE PATIENT HAS NO KNOWN DRUG ALLERGIES.   SOCIAL HISTORY:  No tobacco, alcohol, or drug use.   FAMILY HISTORY:  Noncontributory.   PHYSICAL EXAM:  MOST RECENT VITAL SIGNS:  Temperature 97.7.  Heart rate 70.  Blood pressure 98/50.  Respirations 18.  O2 sat 96% on room air.  GENERAL:  The patient is alert and oriented x3.  No apparent distress.  HEENT:  Normocephalic and atraumatic.  Mucous membranes are dry.  NECK:  He has no carotid bruits.  HEART:  Regular rate and rhythm, S1, S2.  LUNGS:  Clear to auscultation bilaterally.  ABDOMEN:  Soft, nontender, nondistended.  Positive bowel sounds.  MUSCULOSKELETAL:  I deferred musculoskeletal exam secondary to his recent  surgery but he has trace pitting edema.   LAB WORK:  INR 1.2.  Sodium 138.  Potassium 3.9.  Chloride 106.  Bicarb 29.  BUN 12.  Creatinine 1.  Glucose 110.  Calcium 8.3.  White count 3.8.  H & H  9.2 and 26.6.  MCV 98.  Platelet count 158.  UA is unremarkable.   ASSESSMENT AND PLAN:  1. Hypotension and decreased urine output.  Interestingly, the patient      appears to show no other causes for hypotension.  It does not appear      that this is medication related.  It looks to me that this is secondary      to volume loss from his surgery.  His BUN and creatinine and his heart      rate however do not correlate with this.  For now, given the fact that      he has no previous history of heart disease, would favor increasing his      IV  fluids to normal saline at 150 ml an hour and following closely.      Normally I would be hesitant in an 77 year old gentleman to increase      fluids to that rate continuous, but we do want to see some improvement      in his urine output and blood pressure.  I would also try as best as      possible to limit the use of narcotics given that this also plays a      contributing role as well.  We will continue to follow this patient to      ensure improvement.  2. History of aspiration pneumonia, stable.  3. Erectile dysfunction, stable.  4. History of CVA, stable.  5. History of stage 4, non-small cell lung cancer.  Continue medications.  6. History of repaired hip fracture as per orthopedic surgery.  Plan for      anticoagulation.      Hollice Espy, M.D.  Electronically Signed     SKK/MEDQ  D:  01/30/2006  T:  01/30/2006  Job:  [id number]   cc:   Ollen Gross, M.D.  Fax: [phone]   Lajuana Matte, MD  Fax: [phone]

## 2010-10-06 ENCOUNTER — Other Ambulatory Visit: Payer: Self-pay | Admitting: Dermatology

## 2010-10-28 ENCOUNTER — Ambulatory Visit (INDEPENDENT_AMBULATORY_CARE_PROVIDER_SITE_OTHER): Payer: Medicare Other | Admitting: Family Medicine

## 2010-10-28 ENCOUNTER — Encounter: Payer: Self-pay | Admitting: Family Medicine

## 2010-10-28 VITALS — BP 96/52 | Ht 67.0 in | Wt 156.0 lb

## 2010-10-28 DIAGNOSIS — D539 Nutritional anemia, unspecified: Secondary | ICD-10-CM

## 2010-10-28 DIAGNOSIS — I69992 Facial weakness following unspecified cerebrovascular disease: Secondary | ICD-10-CM

## 2010-10-28 DIAGNOSIS — G20 Parkinson's disease: Secondary | ICD-10-CM

## 2010-10-28 DIAGNOSIS — I499 Cardiac arrhythmia, unspecified: Secondary | ICD-10-CM

## 2010-10-28 DIAGNOSIS — I6350 Cerebral infarction due to unspecified occlusion or stenosis of unspecified cerebral artery: Secondary | ICD-10-CM

## 2010-10-28 DIAGNOSIS — G20A1 Parkinson's disease without dyskinesia, without mention of fluctuations: Secondary | ICD-10-CM

## 2010-10-28 DIAGNOSIS — C3490 Malignant neoplasm of unspecified part of unspecified bronchus or lung: Secondary | ICD-10-CM

## 2010-10-28 LAB — CBC WITH DIFFERENTIAL/PLATELET
Basophils Absolute: 0 K/uL (ref 0.0–0.1)
Basophils Relative: 0.6 % (ref 0.0–3.0)
Eosinophils Absolute: 0.1 K/uL (ref 0.0–0.7)
Eosinophils Relative: 1.2 % (ref 0.0–5.0)
HCT: 34.3 % — ABNORMAL LOW (ref 39.0–52.0)
Hemoglobin: 11.5 g/dL — ABNORMAL LOW (ref 13.0–17.0)
Lymphocytes Relative: 24.4 % (ref 12.0–46.0)
Lymphs Abs: 1.1 K/uL (ref 0.7–4.0)
MCHC: 33.6 g/dL (ref 30.0–36.0)
MCV: 101.3 fl — ABNORMAL HIGH (ref 78.0–100.0)
Monocytes Absolute: 0.5 K/uL (ref 0.1–1.0)
Monocytes Relative: 11.6 % (ref 3.0–12.0)
Neutro Abs: 2.9 K/uL (ref 1.4–7.7)
Neutrophils Relative %: 62.2 % (ref 43.0–77.0)
Platelets: 167 K/uL (ref 150.0–400.0)
RBC: 3.39 Mil/uL — ABNORMAL LOW (ref 4.22–5.81)
RDW: 13.9 % (ref 11.5–14.6)
WBC: 4.7 K/uL (ref 4.5–10.5)

## 2010-10-28 LAB — BASIC METABOLIC PANEL
Chloride: 102 mEq/L (ref 96–112)
Potassium: 4.3 mEq/L (ref 3.5–5.1)
Sodium: 137 mEq/L (ref 135–145)

## 2010-10-28 LAB — LIPID PANEL
Cholesterol: 166 mg/dL (ref 0–200)
HDL: 78.7 mg/dL
LDL Cholesterol: 79 mg/dL (ref 0–99)
Total CHOL/HDL Ratio: 2
Triglycerides: 44 mg/dL (ref 0.0–149.0)
VLDL: 8.8 mg/dL (ref 0.0–40.0)

## 2010-10-28 LAB — HEPATIC FUNCTION PANEL
ALT: 5 U/L (ref 0–53)
AST: 21 U/L (ref 0–37)
Albumin: 3.9 g/dL (ref 3.5–5.2)
Alkaline Phosphatase: 56 U/L (ref 39–117)
Bilirubin, Direct: 0.1 mg/dL (ref 0.0–0.3)
Total Bilirubin: 0.6 mg/dL (ref 0.3–1.2)
Total Protein: 7.4 g/dL (ref 6.0–8.3)

## 2010-10-28 LAB — TSH: TSH: 1.5 u[IU]/mL (ref 0.35–5.50)

## 2010-10-28 NOTE — Patient Instructions (Signed)
Please schedule your complete physical at your convenience We'll notify you of your lab results Please have Dr Maple Hudson send me copies of his notes so I can follow along Call with any questions or concerns Welcome!  We're glad to have you!

## 2010-10-28 NOTE — Progress Notes (Signed)
  Subjective:    Patient ID: Christian Scott, male    DOB: 12/11/1920, 77 y.o.   MRN: [id number]  HPI New to establish.  Previous MD- Dr Almedia Balls.  Neuro- [place], [place]  Parkinson's Dz- dx'd 15 yrs ago, following regularly w/ Dr Maple Hudson.  On multiple meds- these are very expensive.  Wife feels meds are working b/c 'he's not really shaking', using a walker to ambulate.  Wife reports he's having difficulty w/ short term memory but does remember remote events.  Skin cancer- has area on top of head that was operated on and is not healing well.  Dr Donzetta Starch has been working on this for 4 yrs.  Pt denies pain.  CVA- occurred while pt was jogging in the late 1990s.  Pt has residual R facial droop but no residual limb weakness.  Wife reports it did not impact speech or swallowing.  Wife is unaware of the cause of stroke.  Lung Cancer- sees Dr Jerolyn Center regularly, next check up is December.  Stage 4.  Has done radiation and chemo.  dx'd 6 yrs ago.  Colonoscopy done 6 yrs ago.  Has not had recent PSA or rectal exam.   Review of Systems For ROS see HPI     Objective:   Physical Exam  Vitals reviewed. Constitutional: No distress.       Frail appearing, ambulates w/ walker  HENT:  Head: Normocephalic.       Large scabbed lesion on crown of head  Neck: Neck supple. No thyromegaly present.  Cardiovascular: Normal rate, normal heart sounds and intact distal pulses.        Rhythm sounds like 3-4 regular beats and then a pause- EKG shows normal sinus  Pulmonary/Chest: Effort normal and breath sounds normal. No respiratory distress. He has no wheezes. He has no rales.  Abdominal: Soft. He exhibits no distension. There is no tenderness. There is no rebound.  Musculoskeletal: He exhibits no edema.  Lymphadenopathy:    He has no cervical adenopathy.  Neurological: He is alert. A cranial nerve deficit (R facial droop) is present.  Skin: Skin is warm and dry.  Psychiatric: He has a normal mood and affect.  His behavior is normal.       Defers to wife to answer questions.  Pt did not recall that he had a stroke          Assessment & Plan:

## 2010-10-29 ENCOUNTER — Telehealth: Payer: Self-pay

## 2010-10-29 LAB — B12 AND FOLATE PANEL
Folate: >1500 ng/mL (ref >5.9)
Vitamin B-12: 323 pg/mL (ref 211–911)

## 2010-10-29 NOTE — Telephone Encounter (Signed)
Message copied by Beverely Low on Wed Oct 29, 2010  4:34 PM ------      Message from: Sheliah Hatch      Created: Wed Oct 29, 2010  4:26 PM       He is mildly anemic but this is stable over the last year.  B12 and folate are normal.  No need for additional work up at this time.  Will follow.

## 2010-10-29 NOTE — Telephone Encounter (Signed)
Pt's wife notified.

## 2010-10-29 NOTE — Telephone Encounter (Signed)
Message copied by Beverely Low on Wed Oct 29, 2010  4:35 PM ------      Message from: Sheliah Hatch      Created: Wed Oct 29, 2010  7:59 AM       Please add B12 and folate to labs if possible- dx anemia.            Remainder of labs look good!

## 2010-11-04 NOTE — Assessment & Plan Note (Signed)
Pt is following w/ Dr Gwenyth Bouillon.  Apparently has stage IV cancer.  Will follow along and assist as able.

## 2010-11-04 NOTE — Assessment & Plan Note (Signed)
Pt has hx of CVA- pt did not remember this.  Has residual R sided facial droop but per wife, no other deficits.  Goal is medical management/risk reduction.  Follows w/ neuro.

## 2010-11-04 NOTE — Assessment & Plan Note (Signed)
On PE pt's heart sounded like it was pausing every 3-4 beats.  On EKG this is not the case- it shows normal sinus rhythm w/ ectopic beat.  Pt asymptomatic.  Will follow.

## 2010-11-04 NOTE — Assessment & Plan Note (Signed)
Pt sees neuro regularly.  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  Wife reports between the 2 of them they are able to 'manage'.  Will follow along and assist as able.

## 2010-11-24 ENCOUNTER — Other Ambulatory Visit: Payer: Self-pay | Admitting: Family Medicine

## 2010-11-24 MED ORDER — TRAMADOL-ACETAMINOPHEN 37.5-325 MG PO TABS: 2.0000 | ORAL_TABLET | Freq: Every day | ORAL | Status: DC

## 2010-11-24 NOTE — Telephone Encounter (Signed)
Ok for #60, 3 refills 

## 2010-11-24 NOTE — Telephone Encounter (Signed)
Patient needs refill tramadol 325-37.5 mg - 2 at night - [facility] - [address] --- dr Tresa Endo was prescribing

## 2010-11-24 NOTE — Telephone Encounter (Signed)
Done

## 2010-11-24 NOTE — Telephone Encounter (Signed)
New Patient 10/28/10.

## 2010-12-08 ENCOUNTER — Other Ambulatory Visit: Payer: Self-pay | Admitting: Dermatology

## 2010-12-25 ENCOUNTER — Ambulatory Visit (HOSPITAL_COMMUNITY)
Admission: RE | Admit: 2010-12-25 | Discharge: 2010-12-25 | Disposition: A | Discharge status: Home / Self Care | Payer: Medicare Other | Source: Ambulatory Visit | Attending: Internal Medicine | Admitting: Internal Medicine

## 2010-12-25 ENCOUNTER — Other Ambulatory Visit: Payer: Self-pay | Admitting: Internal Medicine

## 2010-12-25 ENCOUNTER — Encounter (HOSPITAL_BASED_OUTPATIENT_CLINIC_OR_DEPARTMENT_OTHER): Payer: Medicare Other | Admitting: Internal Medicine

## 2010-12-25 DIAGNOSIS — J90 Pleural effusion, not elsewhere classified: Secondary | ICD-10-CM | POA: Insufficient documentation

## 2010-12-25 DIAGNOSIS — J984 Other disorders of lung: Secondary | ICD-10-CM | POA: Insufficient documentation

## 2010-12-25 DIAGNOSIS — Z85118 Personal history of other malignant neoplasm of bronchus and lung: Secondary | ICD-10-CM

## 2010-12-25 DIAGNOSIS — C3490 Malignant neoplasm of unspecified part of unspecified bronchus or lung: Secondary | ICD-10-CM | POA: Insufficient documentation

## 2010-12-25 LAB — CBC WITH DIFFERENTIAL/PLATELET
EOS%: 1.4 % (ref 0.0–7.0)
Eosinophils Absolute: 0.1 10*3/uL (ref 0.0–0.5)
MCH: 33.9 pg — ABNORMAL HIGH (ref 27.2–33.4)
MCV: 99.4 fL — ABNORMAL HIGH (ref 79.3–98.0)
MONO%: 10.8 % (ref 0.0–14.0)
NEUT#: 2.1 10*3/uL (ref 1.5–6.5)
RBC: 3.53 10*6/uL — ABNORMAL LOW (ref 4.20–5.82)
RDW: 13.5 % (ref 11.0–14.6)
lymph#: 1.4 10*3/uL (ref 0.9–3.3)

## 2010-12-25 LAB — CMP (CANCER CENTER ONLY)
ALT(SGPT): 11 U/L (ref 10–47)
Alkaline Phosphatase: 56 U/L (ref 26–84)
Calcium: 9.1 mg/dL (ref 8.0–10.3)
Potassium: 4.2 mEq/L (ref 3.3–4.7)
Sodium: 144 mEq/L (ref 128–145)

## 2010-12-25 MED ORDER — IOHEXOL 300 MG/ML  SOLN
80.0000 mL | Freq: Once | INTRAMUSCULAR | Status: AC | PRN
  Administered 2010-12-25: 80 mL via INTRAVENOUS

## 2010-12-29 ENCOUNTER — Other Ambulatory Visit: Payer: Self-pay | Admitting: Internal Medicine

## 2010-12-29 ENCOUNTER — Encounter (HOSPITAL_BASED_OUTPATIENT_CLINIC_OR_DEPARTMENT_OTHER): Payer: Medicare Other | Admitting: Internal Medicine

## 2010-12-29 DIAGNOSIS — Z85118 Personal history of other malignant neoplasm of bronchus and lung: Secondary | ICD-10-CM

## 2010-12-29 DIAGNOSIS — C3490 Malignant neoplasm of unspecified part of unspecified bronchus or lung: Secondary | ICD-10-CM

## 2010-12-29 DIAGNOSIS — I2782 Chronic pulmonary embolism: Secondary | ICD-10-CM

## 2011-01-06 ENCOUNTER — Encounter (HOSPITAL_BASED_OUTPATIENT_CLINIC_OR_DEPARTMENT_OTHER): Payer: Medicare Other | Admitting: Internal Medicine

## 2011-01-06 DIAGNOSIS — Z86718 Personal history of other venous thrombosis and embolism: Secondary | ICD-10-CM

## 2011-01-07 ENCOUNTER — Other Ambulatory Visit: Payer: Self-pay | Admitting: *Deleted

## 2011-01-07 ENCOUNTER — Encounter (HOSPITAL_BASED_OUTPATIENT_CLINIC_OR_DEPARTMENT_OTHER): Payer: Medicare Other | Admitting: Internal Medicine

## 2011-01-07 DIAGNOSIS — Z86718 Personal history of other venous thrombosis and embolism: Secondary | ICD-10-CM

## 2011-01-07 MED ORDER — TRAMADOL-ACETAMINOPHEN 37.5-325 MG PO TABS
2.0000 | ORAL_TABLET | Freq: Every day | ORAL | Status: DC
Start: 2011-01-07 — End: 2011-04-01

## 2011-01-07 NOTE — Telephone Encounter (Signed)
Medication form noted per drug intereaction noted per tramadol-acetaminophin. MD noted on form that it is okay to fill the tramadol per pt had been receiving the medication prior to establishing with her. rx sent to pharmacy by e-script

## 2011-01-08 ENCOUNTER — Encounter (HOSPITAL_BASED_OUTPATIENT_CLINIC_OR_DEPARTMENT_OTHER): Payer: Medicare Other | Admitting: Internal Medicine

## 2011-01-08 DIAGNOSIS — Z86718 Personal history of other venous thrombosis and embolism: Secondary | ICD-10-CM

## 2011-01-09 ENCOUNTER — Encounter (HOSPITAL_BASED_OUTPATIENT_CLINIC_OR_DEPARTMENT_OTHER): Payer: Medicare Other | Admitting: Internal Medicine

## 2011-01-09 DIAGNOSIS — Z86718 Personal history of other venous thrombosis and embolism: Secondary | ICD-10-CM

## 2011-01-10 ENCOUNTER — Encounter: Payer: Medicare Other | Admitting: Internal Medicine

## 2011-01-12 DIAGNOSIS — I2699 Other pulmonary embolism without acute cor pulmonale: Secondary | ICD-10-CM | POA: Insufficient documentation

## 2011-01-13 ENCOUNTER — Ambulatory Visit (HOSPITAL_BASED_OUTPATIENT_CLINIC_OR_DEPARTMENT_OTHER): Payer: Medicare Other

## 2011-01-13 ENCOUNTER — Other Ambulatory Visit: Payer: Self-pay | Admitting: Internal Medicine

## 2011-01-13 ENCOUNTER — Encounter: Payer: Self-pay | Admitting: Pharmacist

## 2011-01-13 ENCOUNTER — Other Ambulatory Visit (HOSPITAL_BASED_OUTPATIENT_CLINIC_OR_DEPARTMENT_OTHER): Payer: Medicare Other

## 2011-01-13 VITALS — BP 174/82 | HR 64 | Temp 97.6°F

## 2011-01-13 DIAGNOSIS — I2699 Other pulmonary embolism without acute cor pulmonale: Secondary | ICD-10-CM

## 2011-01-13 DIAGNOSIS — C3490 Malignant neoplasm of unspecified part of unspecified bronchus or lung: Secondary | ICD-10-CM

## 2011-01-13 LAB — PROTIME-INR
INR: 1 — ABNORMAL LOW (ref 2.00–3.50)
Protime: 12 Seconds (ref 10.6–13.4)

## 2011-01-13 LAB — POCT INR: INR: 1

## 2011-01-13 MED ORDER — FONDAPARINUX SODIUM 7.5 MG/0.6ML ~~LOC~~ SOLN
7.5000 mg | SUBCUTANEOUS | Status: AC
  Administered 2011-01-13: 7.5 mg via SUBCUTANEOUS
  Filled 2011-01-13: qty 0.6

## 2011-01-13 NOTE — Progress Notes (Signed)
Patient is a new Coumadin patient who was supposed to begin taking Coumadin 5 mg daily overlapped with Arixtra 7.5 mg SQ daily. There was some confusion and the patient never began taking Coumadin. The patient's INR is of course 1.0 today since the patient has not taken any doses. The patient took his last Arixtra dose on Saturday 11/3.   A full discussion of the nature of anticoagulants has been carried out.  A benefit risk analysis has been presented to the patient, so that they understand the justification for choosing anticoagulation at this time. The need for frequent and regular monitoring, precise dosage adjustment and compliance is stressed.  Side effects of potential bleeding are discussed.  The patient should avoid any OTC items containing aspirin or ibuprofen, and should avoid great swings in general diet.  Avoid alcohol consumption.  Call if any signs of abnormal bleeding.  Next PT/INR test in 6 days; to be seen in Coumadin Clinic thereafter.The patient instructed to restart Coumadin and Arixtra. Patient will need to come to clinic for his Arixtra injections.

## 2011-01-13 NOTE — Progress Notes (Signed)
Coumadin Samples dispensed: Coumadin 5mg  tab #30 Lot# [id number] Exp: 6/14

## 2011-01-13 NOTE — Patient Instructions (Addendum)
1. Begin taking Coumadin 5 mg daily starting today 2. Restart Arixtra injections today. Inject Arixtra 7.5 mg SQ once daily starting today. Injections will be given in the clinic.  Do not stop Arixtra until next visit with Coumadin clinic and instructed otherwise. 3. Come back for next visit/INR check on 01/19/2011. 4. Report any bleeding/bruising immediately. 5. Report any signs/symptoms of leg swelling, shortness of breath, dizziness, chest pain, or chest tightness immediately. 6. Report confusion, any facial drooping, problems moving one side, or balance problems.

## 2011-01-14 ENCOUNTER — Ambulatory Visit (HOSPITAL_BASED_OUTPATIENT_CLINIC_OR_DEPARTMENT_OTHER): Payer: Medicare Other

## 2011-01-14 ENCOUNTER — Telehealth: Payer: Self-pay | Admitting: Internal Medicine

## 2011-01-14 ENCOUNTER — Other Ambulatory Visit: Payer: Self-pay | Admitting: Internal Medicine

## 2011-01-14 VITALS — BP 100/53 | HR 63 | Temp 97.0°F

## 2011-01-14 DIAGNOSIS — Z86718 Personal history of other venous thrombosis and embolism: Secondary | ICD-10-CM

## 2011-01-14 DIAGNOSIS — I2699 Other pulmonary embolism without acute cor pulmonale: Secondary | ICD-10-CM

## 2011-01-14 MED ORDER — FONDAPARINUX SODIUM 7.5 MG/0.6ML ~~LOC~~ SOLN
7.5000 mg | Freq: Once | SUBCUTANEOUS | Status: AC
  Administered 2011-01-14: 7.5 mg via SUBCUTANEOUS
  Filled 2011-01-14: qty 0.6

## 2011-01-14 NOTE — Telephone Encounter (Signed)
completed

## 2011-01-15 ENCOUNTER — Ambulatory Visit (HOSPITAL_BASED_OUTPATIENT_CLINIC_OR_DEPARTMENT_OTHER): Payer: Medicare Other

## 2011-01-15 ENCOUNTER — Other Ambulatory Visit: Payer: Self-pay | Admitting: Oncology

## 2011-01-15 VITALS — BP 96/49 | HR 61 | Temp 97.4°F

## 2011-01-15 DIAGNOSIS — I2699 Other pulmonary embolism without acute cor pulmonale: Secondary | ICD-10-CM

## 2011-01-15 DIAGNOSIS — Z86718 Personal history of other venous thrombosis and embolism: Secondary | ICD-10-CM

## 2011-01-15 MED ORDER — FONDAPARINUX SODIUM 7.5 MG/0.6ML ~~LOC~~ SOLN
7.5000 mg | Freq: Once | SUBCUTANEOUS | Status: AC
  Administered 2011-01-15: 7.5 mg via SUBCUTANEOUS
  Filled 2011-01-15: qty 0.6

## 2011-01-16 ENCOUNTER — Other Ambulatory Visit: Payer: Self-pay | Admitting: Internal Medicine

## 2011-01-16 ENCOUNTER — Other Ambulatory Visit (HOSPITAL_BASED_OUTPATIENT_CLINIC_OR_DEPARTMENT_OTHER): Payer: Medicare Other

## 2011-01-16 ENCOUNTER — Ambulatory Visit: Payer: Self-pay | Admitting: Internal Medicine

## 2011-01-16 ENCOUNTER — Ambulatory Visit: Payer: Medicare Other

## 2011-01-16 ENCOUNTER — Ambulatory Visit (HOSPITAL_BASED_OUTPATIENT_CLINIC_OR_DEPARTMENT_OTHER): Payer: Medicare Other

## 2011-01-16 VITALS — BP 149/78 | HR 71 | Temp 97.6°F

## 2011-01-16 DIAGNOSIS — C782 Secondary malignant neoplasm of pleura: Secondary | ICD-10-CM

## 2011-01-16 DIAGNOSIS — C434 Malignant melanoma of scalp and neck: Secondary | ICD-10-CM

## 2011-01-16 DIAGNOSIS — I2699 Other pulmonary embolism without acute cor pulmonale: Secondary | ICD-10-CM

## 2011-01-16 DIAGNOSIS — Z86718 Personal history of other venous thrombosis and embolism: Secondary | ICD-10-CM

## 2011-01-16 LAB — PROTIME-INR: Protime: 15.6 Seconds — ABNORMAL HIGH (ref 10.6–13.4)

## 2011-01-16 MED ORDER — FONDAPARINUX SODIUM 7.5 MG/0.6ML ~~LOC~~ SOLN
7.5000 mg | Freq: Once | SUBCUTANEOUS | Status: AC
  Administered 2011-01-16: 7.5 mg via SUBCUTANEOUS
  Filled 2011-01-16: qty 0.6

## 2011-01-16 NOTE — Progress Notes (Signed)
Per pt wife, pt has only taken 3 doses of Coumadin, therefore will not increase coumadin dose and check PT/INR on Monday with an appt already scheduled. ***Per Dr. Arbutus Ped, pt may stop Arixtra after 01/17/11 dose despite INR less than goal****

## 2011-01-17 ENCOUNTER — Ambulatory Visit (HOSPITAL_BASED_OUTPATIENT_CLINIC_OR_DEPARTMENT_OTHER): Payer: Medicare Other

## 2011-01-17 VITALS — BP 146/71 | HR 64 | Temp 97.8°F

## 2011-01-17 DIAGNOSIS — I2699 Other pulmonary embolism without acute cor pulmonale: Secondary | ICD-10-CM

## 2011-01-17 MED ORDER — FONDAPARINUX SODIUM 7.5 MG/0.6ML ~~LOC~~ SOLN
7.5000 mg | Freq: Once | SUBCUTANEOUS | Status: AC
  Administered 2011-01-17: 7.5 mg via SUBCUTANEOUS

## 2011-01-18 ENCOUNTER — Ambulatory Visit: Payer: Medicare Other

## 2011-01-19 ENCOUNTER — Ambulatory Visit (HOSPITAL_BASED_OUTPATIENT_CLINIC_OR_DEPARTMENT_OTHER): Payer: Self-pay | Admitting: Internal Medicine

## 2011-01-19 ENCOUNTER — Other Ambulatory Visit: Payer: Self-pay | Admitting: Pharmacist

## 2011-01-19 ENCOUNTER — Ambulatory Visit: Payer: Medicare Other

## 2011-01-19 ENCOUNTER — Other Ambulatory Visit: Payer: Self-pay | Admitting: Internal Medicine

## 2011-01-19 ENCOUNTER — Other Ambulatory Visit (HOSPITAL_BASED_OUTPATIENT_CLINIC_OR_DEPARTMENT_OTHER): Payer: Medicare Other

## 2011-01-19 DIAGNOSIS — Z86718 Personal history of other venous thrombosis and embolism: Secondary | ICD-10-CM

## 2011-01-19 DIAGNOSIS — I2699 Other pulmonary embolism without acute cor pulmonale: Secondary | ICD-10-CM

## 2011-01-19 LAB — PROTIME-INR

## 2011-01-19 LAB — POCT INR: INR: 1.8

## 2011-01-19 NOTE — Progress Notes (Signed)
Pt no longer on Arixtra.  Completed 01/17/11.  MD requests that we adjust coumadin doses at this point. Wife present at visit.  We went through dosing instructions to make sure she understood them. The pt & his wife will be out of town for Thanksgiving from 11/20 until 11/26. Marily Lente, Pharm.D.

## 2011-01-26 ENCOUNTER — Other Ambulatory Visit (HOSPITAL_BASED_OUTPATIENT_CLINIC_OR_DEPARTMENT_OTHER): Payer: Medicare Other | Admitting: Lab

## 2011-01-26 ENCOUNTER — Ambulatory Visit: Payer: Medicare Other

## 2011-01-26 ENCOUNTER — Other Ambulatory Visit: Payer: Self-pay | Admitting: Internal Medicine

## 2011-01-26 DIAGNOSIS — Z86718 Personal history of other venous thrombosis and embolism: Secondary | ICD-10-CM

## 2011-01-26 DIAGNOSIS — I2699 Other pulmonary embolism without acute cor pulmonale: Secondary | ICD-10-CM

## 2011-01-26 LAB — POCT INR: INR: 2

## 2011-01-26 LAB — PROTIME-INR
INR: 2 (ref 2.00–3.50)
Protime: 24 Seconds — ABNORMAL HIGH (ref 10.6–13.4)

## 2011-01-26 NOTE — Progress Notes (Unsigned)
No complaints.  Will continue current dose.  Recheck INR in 2 weeks to ensure that he stays in therapeutic range.

## 2011-01-28 ENCOUNTER — Other Ambulatory Visit: Payer: Self-pay | Admitting: Internal Medicine

## 2011-01-28 DIAGNOSIS — I2699 Other pulmonary embolism without acute cor pulmonale: Secondary | ICD-10-CM

## 2011-02-03 ENCOUNTER — Other Ambulatory Visit: Payer: Self-pay | Admitting: Dermatology

## 2011-02-04 ENCOUNTER — Ambulatory Visit (INDEPENDENT_AMBULATORY_CARE_PROVIDER_SITE_OTHER)
Admission: RE | Admit: 2011-02-04 | Discharge: 2011-02-04 | Disposition: A | Discharge status: Home / Self Care | Payer: Medicare Other | Source: Ambulatory Visit | Attending: Family | Admitting: Family

## 2011-02-04 ENCOUNTER — Encounter (HOSPITAL_COMMUNITY): Payer: Self-pay | Admitting: Internal Medicine

## 2011-02-04 ENCOUNTER — Ambulatory Visit (INDEPENDENT_AMBULATORY_CARE_PROVIDER_SITE_OTHER): Payer: Medicare Other | Admitting: Family

## 2011-02-04 ENCOUNTER — Encounter: Payer: Self-pay | Admitting: Family

## 2011-02-04 ENCOUNTER — Inpatient Hospital Stay (HOSPITAL_COMMUNITY)
Admission: AD | Admit: 2011-02-04 | Discharge: 2011-02-07 | DRG: 186 | Disposition: A | Discharge status: Home Health | Payer: Medicare Other | Source: Ambulatory Visit | Attending: Family Medicine | Admitting: Family Medicine

## 2011-02-04 VITALS — BP 124/80 | HR 84 | Temp 98.7°F | Resp 18 | Wt 158.0 lb

## 2011-02-04 DIAGNOSIS — R05 Cough: Secondary | ICD-10-CM

## 2011-02-04 DIAGNOSIS — R059 Cough, unspecified: Secondary | ICD-10-CM | POA: Diagnosis present

## 2011-02-04 DIAGNOSIS — J189 Pneumonia, unspecified organism: Secondary | ICD-10-CM | POA: Diagnosis present

## 2011-02-04 DIAGNOSIS — Z66 Do not resuscitate: Secondary | ICD-10-CM | POA: Diagnosis present

## 2011-02-04 DIAGNOSIS — I2699 Other pulmonary embolism without acute cor pulmonale: Secondary | ICD-10-CM

## 2011-02-04 DIAGNOSIS — R0989 Other specified symptoms and signs involving the circulatory and respiratory systems: Secondary | ICD-10-CM

## 2011-02-04 DIAGNOSIS — C3490 Malignant neoplasm of unspecified part of unspecified bronchus or lung: Secondary | ICD-10-CM

## 2011-02-04 DIAGNOSIS — G20A1 Parkinson's disease without dyskinesia, without mention of fluctuations: Secondary | ICD-10-CM | POA: Diagnosis present

## 2011-02-04 DIAGNOSIS — Z85828 Personal history of other malignant neoplasm of skin: Secondary | ICD-10-CM

## 2011-02-04 DIAGNOSIS — R509 Fever, unspecified: Secondary | ICD-10-CM

## 2011-02-04 DIAGNOSIS — J90 Pleural effusion, not elsewhere classified: Secondary | ICD-10-CM

## 2011-02-04 DIAGNOSIS — H3530 Unspecified macular degeneration: Secondary | ICD-10-CM | POA: Diagnosis present

## 2011-02-04 DIAGNOSIS — R410 Disorientation, unspecified: Secondary | ICD-10-CM

## 2011-02-04 DIAGNOSIS — Z79899 Other long term (current) drug therapy: Secondary | ICD-10-CM

## 2011-02-04 DIAGNOSIS — Z8673 Personal history of transient ischemic attack (TIA), and cerebral infarction without residual deficits: Secondary | ICD-10-CM

## 2011-02-04 DIAGNOSIS — Z85118 Personal history of other malignant neoplasm of bronchus and lung: Secondary | ICD-10-CM

## 2011-02-04 DIAGNOSIS — G20 Parkinson's disease: Secondary | ICD-10-CM

## 2011-02-04 DIAGNOSIS — Z86711 Personal history of pulmonary embolism: Secondary | ICD-10-CM

## 2011-02-04 DIAGNOSIS — J942 Hemothorax: Secondary | ICD-10-CM | POA: Diagnosis present

## 2011-02-04 DIAGNOSIS — I6350 Cerebral infarction due to unspecified occlusion or stenosis of unspecified cerebral artery: Secondary | ICD-10-CM

## 2011-02-04 DIAGNOSIS — Z7901 Long term (current) use of anticoagulants: Secondary | ICD-10-CM

## 2011-02-04 HISTORY — DX: Pleural effusion, not elsewhere classified: J90

## 2011-02-04 HISTORY — DX: Cerebral infarction, unspecified: I63.9

## 2011-02-04 HISTORY — DX: Other pulmonary embolism without acute cor pulmonale: I26.99

## 2011-02-04 HISTORY — DX: Disorientation, unspecified: R41.0

## 2011-02-04 HISTORY — DX: Unspecified macular degeneration: H35.30

## 2011-02-04 HISTORY — DX: Malignant neoplasm of unspecified part of unspecified bronchus or lung: C34.90

## 2011-02-04 HISTORY — DX: Pneumonia, unspecified organism: J18.9

## 2011-02-04 LAB — COMPREHENSIVE METABOLIC PANEL
ALT: 10 U/L (ref 0–53)
AST: 22 U/L (ref 0–37)
Alkaline Phosphatase: 70 U/L (ref 39–117)
CO2: 26 mEq/L (ref 19–32)
GFR calc Af Amer: 88 mL/min — ABNORMAL LOW (ref >90)
GFR calc non Af Amer: 76 mL/min — ABNORMAL LOW (ref >90)
Glucose, Bld: 88 mg/dL (ref 70–99)
Potassium: 4.3 mEq/L (ref 3.5–5.1)
Sodium: 138 mEq/L (ref 135–145)
Total Protein: 7.4 g/dL (ref 6.0–8.3)

## 2011-02-04 LAB — CBC
HCT: 33.2 % — ABNORMAL LOW (ref 39.0–52.0)
MCH: 33.2 pg (ref 26.0–34.0)
MCHC: 34.3 g/dL (ref 30.0–36.0)
RDW: 13 % (ref 11.5–15.5)

## 2011-02-04 LAB — MAGNESIUM: Magnesium: 2.1 mg/dL (ref 1.5–2.5)

## 2011-02-04 MED ORDER — CARBIDOPA-LEVODOPA-ENTACAPONE 25-100-200 MG PO TABS
1.0000 | ORAL_TABLET | Freq: Three times a day (TID) | ORAL | Status: DC
  Administered 2011-02-05 – 2011-02-07 (×8): 1 via ORAL
  Filled 2011-02-04 (×5): qty 1

## 2011-02-04 MED ORDER — ACETAMINOPHEN 325 MG PO TABS: 650.0000 mg | ORAL_TABLET | Freq: Four times a day (QID) | ORAL | Status: DC | PRN

## 2011-02-04 MED ORDER — RASAGILINE MESYLATE 0.5 MG PO TABS
1.0000 mg | ORAL_TABLET | Freq: Every day | ORAL | Status: DC
  Administered 2011-02-05 – 2011-02-07 (×3): 1 mg via ORAL
  Filled 2011-02-04 (×3): qty 2

## 2011-02-04 MED ORDER — MOXIFLOXACIN HCL IN NACL 400 MG/250ML IV SOLN
400.0000 mg | INTRAVENOUS | Status: DC
  Administered 2011-02-04 – 2011-02-05 (×2): 400 mg via INTRAVENOUS
  Filled 2011-02-04 (×3): qty 250

## 2011-02-04 MED ORDER — ONDANSETRON HCL 4 MG/2ML IJ SOLN: 4.0000 mg | Freq: Four times a day (QID) | INTRAMUSCULAR | Status: DC | PRN

## 2011-02-04 MED ORDER — TRAMADOL-ACETAMINOPHEN 37.5-325 MG PO TABS
2.0000 | ORAL_TABLET | Freq: Every day | ORAL | Status: DC
  Administered 2011-02-04 – 2011-02-06 (×3): 2 via ORAL
  Filled 2011-02-04 (×4): qty 2

## 2011-02-04 MED ORDER — ONDANSETRON HCL 4 MG PO TABS: 4.0000 mg | ORAL_TABLET | Freq: Four times a day (QID) | ORAL | Status: DC | PRN

## 2011-02-04 MED ORDER — WARFARIN SODIUM 5 MG PO TABS
5.0000 mg | ORAL_TABLET | Freq: Every day | ORAL | Status: DC
  Filled 2011-02-04: qty 1

## 2011-02-04 MED ORDER — CARBIDOPA 25 MG PO TABS
25.0000 mg | ORAL_TABLET | Freq: Three times a day (TID) | ORAL | Status: DC
  Administered 2011-02-05: 1 via ORAL
  Administered 2011-02-05: 16:00:00 via ORAL
  Administered 2011-02-05 – 2011-02-06 (×5): 1 via ORAL
  Administered 2011-02-07: 10:00:00 via ORAL
  Filled 2011-02-04 (×3): qty 1

## 2011-02-04 MED ORDER — SODIUM CHLORIDE 0.9 % IJ SOLN
3.0000 mL | Freq: Two times a day (BID) | INTRAMUSCULAR | Status: DC
  Administered 2011-02-04 – 2011-02-07 (×6): 3 mL via INTRAVENOUS

## 2011-02-04 MED ORDER — THERA M PLUS PO TABS
1.0000 | ORAL_TABLET | Freq: Every day | ORAL | Status: DC
  Administered 2011-02-05 – 2011-02-07 (×3): 1 via ORAL
  Filled 2011-02-04 (×3): qty 1

## 2011-02-04 MED ORDER — ONE-DAILY MULTI VITAMINS PO TABS: 1.0000 | ORAL_TABLET | Freq: Every day | ORAL | Status: DC

## 2011-02-04 MED ORDER — ALBUTEROL SULFATE (5 MG/ML) 0.5% IN NEBU: 2.5000 mg | INHALATION_SOLUTION | RESPIRATORY_TRACT | Status: DC | PRN

## 2011-02-04 MED ORDER — ACETAMINOPHEN 650 MG RE SUPP: 650.0000 mg | Freq: Four times a day (QID) | RECTAL | Status: DC | PRN

## 2011-02-04 NOTE — Progress Notes (Signed)
ANTICOAGULATION CONSULT NOTE - Initial Consult  Pharmacy Consult for Coumadin Indication: pulmonary embolus  Allergies  Allergen Reactions  . Morphine And Related Other (See Comments)    hallucinations    Patient Measurements:   Adjusted Body Weight:   Vital Signs: Temp: 98.8 F (37.1 C) (11/28 1850) Temp src: Oral (11/28 1850) BP: 158/81 mmHg (11/28 1850) Pulse Rate: 74  (11/28 1850)  Labs: No results found for this basename: HGB:2,HCT:3,PLT:3,APTT:3,LABPROT:3,INR:3,HEPARINUNFRC:3,CREATININE:3,CKTOTAL:3,CKMB:3,TROPONINI:3 in the last 72 hours The CrCl is unknown because both a height and weight (above a minimum accepted value) are required for this calculation.  Medical History: Past Medical History  Diagnosis Date  . Cancer     lung ca and skin  . Parkinson's disease     Medications:  Prescriptions prior to admission  Medication Sig Dispense Refill  . Carbidopa (LODOSYN) 25 MG tablet Take 25 mg by mouth 3 (three) times daily.        . carbidopa-levodopa-entacapone (STALEVO) 25-100-200 MG per tablet Take 1 tablet by mouth 3 (three) times daily.        . Multiple Vitamin (MULTIVITAMIN) tablet Take 1 tablet by mouth daily.        . rasagiline (AZILECT) 0.5 MG TABS Take 1 mg by mouth daily.        . traMADol-acetaminophen (ULTRACET) 37.5-325 MG per tablet Take 2 tablets by mouth at bedtime.  60 tablet  3  . warfarin (COUMADIN) 5 MG tablet Take 5 mg by mouth daily.        Scheduled:    . Carbidopa  25 mg Oral TID  . carbidopa-levodopa-entacapone  1 tablet Oral TID  . moxifloxacin  400 mg Intravenous Q24H  . multivitamins ther. w/minerals  1 tablet Oral Daily  . rasagiline  1 mg Oral Daily  . sodium chloride  3 mL Intravenous Q12H  . traMADol-acetaminophen  2 tablet Oral QHS  . DISCONTD: multivitamin  1 tablet Oral Daily    Assessment: Patient with hx of PE diagnosed 4/12 on Coumadin PTA. Noted patient home dose of Coumadin 5mg  daily, has taken his dose at home  today.  INR is 2.34 today, therapeutic. CBC ok at baseline.  Goal of Therapy:  INR 2-3   Plan: 1. Check PT/INR now and daily. 2. No further Coumadin tonight. 3. Will f/up daily PT/INR- will resume home regimen of Coumadin 5mg  daily.   Dayzha Pogosyan K. Allena Katz, PharmD, BCPS.  Clinical Pharmacist Pager [phone]. 02/04/2011 8:46 PM

## 2011-02-04 NOTE — H&P (Signed)
Christian Scott is an 77 y.o. male.   Chief Complaint: Cough. HPI: 77 year old male with known history of recent diagnosis of pulmonary embolism on Coumadin, history of Parkinson's disease, history of lung cancer treated 6 years ago with chemotherapy and radiation has been experiencing cough since yesterday. The cough has been progressing worsening and patient also has been feeling fatigued. Patient's wife took her to the PCP today and had a chest x-ray done. Chest x-ray was showing moderate left-sided pleural effusion and given the patient's symptoms was admitted directly to [facility] concerning for pneumonia. Patient denies any shortness of breath, chest pain, fever chills, nausea vomiting or abdominal pain. Patient has poor vision from macular degeneration.  Past Medical History  Diagnosis Date  . Cancer     lung ca and skin  . Parkinson's disease     Past Surgical History  Procedure Date  . Scalp surgery   . Hip surgery   . Knee surgery     Family History  Problem Relation Age of Onset  . Diabetes type II Mother   . Stroke Mother    Social History:  reports that he has never smoked. He has never used smokeless tobacco. He reports that he drinks alcohol. His drug history not on file.  Allergies:  Allergies  Allergen Reactions  . Morphine And Related Other (See Comments)    hallucinations    Medications Prior to Admission  Medication Dose Route Frequency Provider Last Rate Last Dose  . acetaminophen (TYLENOL) tablet 650 mg  650 mg Oral Q6H PRN Eduard Clos       Or  . acetaminophen (TYLENOL) suppository 650 mg  650 mg Rectal Q6H PRN Eduard Clos      . albuterol (PROVENTIL) (5 MG/ML) 0.5% nebulizer solution 2.5 mg  2.5 mg Nebulization Q2H PRN Eduard Clos      . Carbidopa 1 tablet  25 mg Oral TID Eduard Clos      . carbidopa-levodopa-entacapone (STALEVO) 25-100-200 MG per tablet 1 tablet  1 tablet Oral TID Eduard Clos        . moxifloxacin (AVELOX) IVPB 400 mg  400 mg Intravenous Q24H Eduard Clos      . multivitamin tablet 1 tablet  1 tablet Oral Daily Eduard Clos      . ondansetron (ZOFRAN) tablet 4 mg  4 mg Oral Q6H PRN Eduard Clos       Or  . ondansetron (ZOFRAN) injection 4 mg  4 mg Intravenous Q6H PRN Eduard Clos      . rasagiline (AZILECT) tablet 1 mg  1 mg Oral Daily Eduard Clos      . sodium chloride 0.9 % injection 3 mL  3 mL Intravenous Q12H Eduard Clos      . traMADol-acetaminophen (ULTRACET) 37.5-325 MG per tablet 2 tablet  2 tablet Oral QHS Eduard Clos       Medications Prior to Admission  Medication Sig Dispense Refill  . Carbidopa (LODOSYN) 25 MG tablet Take 25 mg by mouth 3 (three) times daily.        . carbidopa-levodopa-entacapone (STALEVO) 25-100-200 MG per tablet Take 1 tablet by mouth 3 (three) times daily.        . Multiple Vitamin (MULTIVITAMIN) tablet Take 1 tablet by mouth daily.        . rasagiline (AZILECT) 0.5 MG TABS Take 1 mg by mouth daily.        Marland Kitchen  traMADol-acetaminophen (ULTRACET) 37.5-325 MG per tablet Take 2 tablets by mouth at bedtime.  60 tablet  3  . warfarin (COUMADIN) 5 MG tablet Take 5 mg by mouth daily.         No results found for this or any previous visit (from the past 48 hour(s)). Dg Chest 2 View  02/04/2011  *RADIOLOGY REPORT*  Clinical Data: Lung cancer with congestion and cough.  CHEST - 2 VIEW  Comparison: CT chest 12/25/2010.  Chest radiograph 02/26/2009.  Findings: Trachea is midline.  Heart size stable.  Thoracic aorta is calcified. There is dense soft tissue in the region of the left hilum but no corresponding adenopathy and 12/25/2010.  There is a moderate left pleural effusion, with some loculation.  Left basilar airspace disease.  Linear scarring at the right lung base.  Lower thoracic or upper lumbar compression fracture is stable.  IMPRESSION:  1.  Moderate left pleural effusion appears  partially loculated. 2.  Left basilar airspace disease, some or all of which may be chronic.  Original Report Authenticated By: Reyes Ivan, M.D.    Review of Systems  HENT: Negative.   Eyes: Negative.   Respiratory: Positive for cough.   Cardiovascular: Negative.   Gastrointestinal: Negative.   Genitourinary: Negative.   Musculoskeletal: Negative.   Skin: Negative.   Neurological: Positive for weakness.  Endo/Heme/Allergies: Negative.   Psychiatric/Behavioral: Negative.     Blood pressure 158/81, pulse 74, temperature 98.8 F (37.1 C), temperature source Oral, resp. rate 20, SpO2 94.00%. Physical Exam  Constitutional: He is oriented to person, place, and time. He appears well-developed and well-nourished.  HENT:  Head: Normocephalic and atraumatic.  Right Ear: External ear normal.  Left Ear: External ear normal.  Nose: Nose normal.  Mouth/Throat: Oropharynx is clear and moist.  Eyes: Conjunctivae and EOM are normal. Pupils are equal, round, and reactive to light.       Poor vision.  Neck: Normal range of motion. Neck supple.  Cardiovascular: Normal rate, regular rhythm, normal heart sounds and intact distal pulses.   Respiratory: Effort normal and breath sounds normal.  GI: Soft. Bowel sounds are normal.  Musculoskeletal: Normal range of motion.  Neurological: He is alert and oriented to person, place, and time. He has normal reflexes.  Skin:       Had skin biopsy on the right foot.  Psychiatric: His behavior is normal.     Assessment/Plan #1. Cough with pleural effusion concerning for pneumonia. #2. Recently diagnosed pulmonary embolism. #3. History of lung cancer treated 6 years ago with chemotherapy and radiation. #4. History of Parkinson's disease. #5. Poor vision from macular degeneration.  Plan Admit to medical floor Reviewing the x-ray report patient has possible loculation of the pleural effusion. At this time and will place patient on Avelox and we'll  get a CAT scan chest To further study his pleural effusion. We'll also check for flu PCR and patient will be on droplet precautions until then. Coumadin will be dosed per pharmacy. We are going to get basic labs. Further recommendations will be based on the tests ordered particularly CAT scan chest.  Meeyah Ovitt N. 02/04/2011, 8:13 PM

## 2011-02-04 NOTE — Progress Notes (Signed)
Subjective:    Patient ID: Christian Scott, male    DOB: 06-Jan-1921, 77 y.o.   MRN: [id number]  HPI  Mr.  Scott is an 77 yr old male with history of PD, non-small cell lung cancer (diagnosed 6 yrs ago s/p radiation and chemo) and recently diagnosed PE (anticoagulated with coumadin and followed by Dr. Gwenyth Bouillon), who presents today with chief complaint of cough. He is accompanied by his wife who reports that his cough seems to be worsening. Symptoms started 3-5 days ago.  Occasionally productive of clear sputum.  Wife reports no associated fever.  Pt has hx of PE approximately 1 month ago and he is being treated with coumadin by Dr. Gwenyth Bouillon. Wife notes that he has had some confusion, but that it is at baseline.    Review of Systems See HPI    Past Medical History  Diagnosis Date  . Cancer     lung ca and skin  . Parkinson's disease     History   Social History  . Marital Status: Married    Spouse Name: N/A    Number of Children: N/A  . Years of Education: N/A   Occupational History  . Not on file.   Social History Main Topics  . Smoking status: Never Smoker   . Smokeless tobacco: Never Used  . Alcohol Use: Yes     glass of wine 4 times per weely  . Drug Use: Not on file  . Sexually Active: Not on file   Other Topics Concern  . Not on file   Social History Narrative  . No narrative on file    No past surgical history on file.  No family history on file.  Allergies  Allergen Reactions  . Morphine And Related Other (See Comments)    hallucinations    Current Outpatient Prescriptions on File Prior to Visit  Medication Sig Dispense Refill  . Carbidopa (LODOSYN) 25 MG tablet Take 25 mg by mouth 3 (three) times daily.        . carbidopa-levodopa-entacapone (STALEVO) 25-100-200 MG per tablet Take 1 tablet by mouth 3 (three) times daily.        . fondaparinux (ARIXTRA) 7.5 MG/0.6ML SOLN Inject 7.5 mg into the skin daily. Continue taking daily until INR therapeutic as  directed by the [facility].      . Multiple Vitamin (MULTIVITAMIN) tablet Take 1 tablet by mouth daily.        . rasagiline (AZILECT) 0.5 MG TABS Take 1 mg by mouth daily.        . traMADol-acetaminophen (ULTRACET) 37.5-325 MG per tablet Take 2 tablets by mouth at bedtime.  60 tablet  3  . warfarin (COUMADIN) 5 MG tablet Take 5 mg by mouth daily.         BP 124/80  Pulse 84  Temp(Src) 98.7 F (37.1 C) (Oral)  Resp 18  Wt 158 lb (71.668 kg)  SpO2 96%    Objective:   Physical Exam  Constitutional:       Frail elderly white male seated on exam table.   HENT:  Right Ear: Tympanic membrane and ear canal normal.  Left Ear: Tympanic membrane and ear canal normal.  Mouth/Throat: No posterior oropharyngeal edema or posterior oropharyngeal erythema.  Cardiovascular: Normal rate and regular rhythm.   No murmur heard. Pulmonary/Chest: Effort normal.       Faint right sided crackles, diminished breath sounds throughout but especially at the left base.  No increased work of  breathing.    Skin:       + skin lesion noted on top of scalp   Bandage noted on top of left ear.   Psychiatric: His mood appears not anxious. Cognition and memory are impaired. He does not exhibit a depressed mood.          Assessment & Plan:  Case was discussed with [facility] who accepted pt to [facility] team 2 for a tele bed.  Pt was assigned to the 5th floor and I spoke with his wife and daughter who will bring him to admitting this Evening.

## 2011-02-04 NOTE — Patient Instructions (Signed)
Please follow up with Dr. Beverely Low in 1 week. Call if fever, shortness of breath, or worsening cough. Complete your chest x-ray on the first floor this afternoon- we will call you with the results.

## 2011-02-04 NOTE — Assessment & Plan Note (Signed)
CXR performed notes moderate left pleural effusion appears partially loculated and Left basilar airspace disease.  Due to pt's frail condition, multiple medical problems, recent PE and new note of loculated effusion, will plan to admit him for further pulmonary work up, IV antibiotics and closer monitoring.

## 2011-02-04 NOTE — Assessment & Plan Note (Signed)
This is being managed by Dr. Gwenyth Bouillon.  He is currently maintained on coumadin 5mg  once daily.

## 2011-02-05 ENCOUNTER — Inpatient Hospital Stay (HOSPITAL_COMMUNITY): Payer: Medicare Other

## 2011-02-05 ENCOUNTER — Encounter (HOSPITAL_COMMUNITY): Payer: Self-pay | Admitting: Radiology

## 2011-02-05 DIAGNOSIS — S272XXA Traumatic hemopneumothorax, initial encounter: Secondary | ICD-10-CM

## 2011-02-05 LAB — INFLUENZA PANEL BY PCR (TYPE A & B): H1N1 flu by pcr: NOT DETECTED

## 2011-02-05 LAB — CBC
HCT: 30.2 % — ABNORMAL LOW (ref 39.0–52.0)
Hemoglobin: 10.2 g/dL — ABNORMAL LOW (ref 13.0–17.0)
MCH: 32.8 pg (ref 26.0–34.0)
MCHC: 33.6 g/dL (ref 30.0–36.0)
MCV: 97.1 fL (ref 78.0–100.0)
MCV: 97.1 fL (ref 78.0–100.0)
Platelets: 145 10*3/uL — ABNORMAL LOW (ref 150–400)
RBC: 3.11 MIL/uL — ABNORMAL LOW (ref 4.22–5.81)
RDW: 13 % (ref 11.5–15.5)
WBC: 3.7 10*3/uL — ABNORMAL LOW (ref 4.0–10.5)
WBC: 5 10*3/uL (ref 4.0–10.5)

## 2011-02-05 LAB — BASIC METABOLIC PANEL
CO2: 26 mEq/L (ref 19–32)
Calcium: 8.5 mg/dL (ref 8.4–10.5)
Chloride: 104 mEq/L (ref 96–112)
Glucose, Bld: 79 mg/dL (ref 70–99)
Potassium: 3.8 mEq/L (ref 3.5–5.1)
Sodium: 138 mEq/L (ref 135–145)

## 2011-02-05 LAB — PROTIME-INR: INR: 2.31 — ABNORMAL HIGH (ref 0.00–1.49)

## 2011-02-05 MED ORDER — WHITE PETROLATUM GEL
Status: AC
  Filled 2011-02-05: qty 5

## 2011-02-05 NOTE — Consult Note (Signed)
[address]            [place] [address]          [phone]       LEJUAN BOTTO [facility] Medical Record #[id number] Date of Birth: 04/08/1920  No ref. provider found Neena Rhymes, MD, MD  Chief Complaint:  dysneaNo chief complaint on file.   History of Present Illness: An 77 year old patient was treated for lung cancer 6 years ago. We did a bronchoscopy that time. He started on Coumadin one month ago for question pulmonary embolus. He now presents with a loculated left effusion. His hematocrit is 30 his protime is elevated. There is a question that the loculated left pleural effusion could have some blood in the effusion.  The patient is debilitated and is a DO NOT RESUSCITATE. I would follow this effusion with serial chest x-rays. I would not recommend any further treatment such as chest tube or thoracentesis. If this is a hemothorax it should partially resolve with time. The only mild.problem is that it might become infected. At that time we may have to consider drainage.    Current Activity/ Functional Status: The patient is very debilitated.   Past Medical History  Diagnosis Date  . Parkinson's disease   . Macular degeneration, left eye     dry  . Pulmonary embolism 02/04/11    recent h/o  . Pneumonia 02/04/11    "here cause he may have pneumonia; never had it before"  . Stroke 1990's    "brought on his Parkinson's; drags right foot"  . Confusion 02/04/11    "he's getting worse"  . Pleural effusion 02/04/11    "today"  . Lung cancer 2006     S/P chemo & radiation  . Cancer     skin    Past Surgical History  Procedure Date  . Scalp surgery ~ 2008    S/P skin cancer excision  . Hip surgery ~ 2008    left; replacement  . Knee surgery     right; replacement  . Joint replacement   . Refractive surgery 1990's    bilaterally    History  Smoking status  . Never Smoker   Smokeless tobacco  . Never Used      History  Alcohol Use  . 2.4 oz/week  . 4 Glasses of wine per week    History   Social History  . Marital Status: Married    Spouse Name: N/A    Number of Children: N/A  . Years of Education: N/A   Occupational History  . Not on file.   Social History Main Topics  . Smoking status: Never Smoker   . Smokeless tobacco: Never Used  . Alcohol Use: 2.4 oz/week    4 Glasses of wine per week  . Drug Use: Not on file  . Sexually Active: No   Other Topics Concern  . Not on file   Social History Narrative  . No narrative on file    Allergies  Allergen Reactions  . Morphine And Related Other (See Comments)    hallucinations    Current Facility-Administered Medications  Medication Dose Route Frequency Provider Last Rate Last Dose  . acetaminophen (TYLENOL) tablet 650 mg  650 mg Oral Q6H PRN Eduard Clos       Or  . acetaminophen (TYLENOL) suppository 650 mg  650  mg Rectal Q6H PRN Eduard Clos      . albuterol (PROVENTIL) (5 MG/ML) 0.5% nebulizer solution 2.5 mg  2.5 mg Nebulization Q2H PRN Eduard Clos      . Carbidopa 1 tablet  25 mg Oral TID Eduard Clos      . carbidopa-levodopa-entacapone (STALEVO) 25-100-200 MG per tablet 1 tablet  1 tablet Oral TID Eduard Clos   1 tablet at 02/05/11 1604  . moxifloxacin (AVELOX) IVPB 400 mg  400 mg Intravenous Q24H Arshad N. Kakrakandy   400 mg at 02/04/11 2202  . multivitamins ther. w/minerals tablet 1 tablet  1 tablet Oral Daily Pandora Leiter   1 tablet at 02/05/11 1113  . ondansetron (ZOFRAN) tablet 4 mg  4 mg Oral Q6H PRN Eduard Clos       Or  . ondansetron (ZOFRAN) injection 4 mg  4 mg Intravenous Q6H PRN Eduard Clos      . rasagiline (AZILECT) tablet 1 mg  1 mg Oral Daily Arshad N. Kakrakandy   1 mg at 02/05/11 1113  . sodium chloride 0.9 % injection 3 mL  3 mL Intravenous Q12H Arshad N. Kakrakandy   3 mL at 02/05/11 1114  . traMADol-acetaminophen (ULTRACET)  37.5-325 MG per tablet 2 tablet  2 tablet Oral QHS Eduard Clos   2 tablet at 02/04/11 2212  . white petrolatum (VASELINE) gel           . DISCONTD: multivitamin tablet 1 tablet  1 tablet Oral Daily Eduard Clos      . DISCONTD: warfarin (COUMADIN) tablet 5 mg  5 mg Oral q1800 Christella Hartigan, PHARMD         Family History  Problem Relation Age of Onset  . Diabetes type II Mother   . Stroke Mother      Review of Systems:   Pertinent items are noted in HPI.  Physical Exam: BP 106/59  Pulse 60  Temp(Src) 97.7 F (36.5 C) (Oral)  Resp 20  Ht 5\' 10"  (1.778 m)  Wt 71.668 kg (158 lb)  BMI 22.67 kg/m2  SpO2 81%  General appearance: appears older than stated age Heart: regular rate and rhythm, S1, S2 normal, no murmur, click, rub or gallop Lungs: diminished breath sounds LLL   Diagnostic Studies & Laboratory data:     Recent Radiology Findings:   Dg Chest 2 View  02/04/2011  *RADIOLOGY REPORT*  Clinical Data: Lung cancer with congestion and cough.  CHEST - 2 VIEW  Comparison: CT chest 12/25/2010.  Chest radiograph 02/26/2009.  Findings: Trachea is midline.  Heart size stable.  Thoracic aorta is calcified. There is dense soft tissue in the region of the left hilum but no corresponding adenopathy and 12/25/2010.  There is a moderate left pleural effusion, with some loculation.  Left basilar airspace disease.  Linear scarring at the right lung base.  Lower thoracic or upper lumbar compression fracture is stable.  IMPRESSION:  1.  Moderate left pleural effusion appears partially loculated. 2.  Left basilar airspace disease, some or all of which may be chronic.  Original Report Authenticated By: Reyes Ivan, M.D.   Ct Chest Wo Contrast  02/05/2011  *RADIOLOGY REPORT*  Clinical Data: Short of breath and cough.  Evaluate pleural effusion.  History of lung cancer in 2006.  Radiation therapy and chemotherapy complete.  CT CHEST WITHOUT CONTRAST  Technique:  Multidetector  CT imaging of the chest was performed following the standard  protocol without IV contrast.  Comparison: Plain film of 02/04/2011.  CT of 12/25/2010.  Findings: Lung windows demonstrate soft tissue density within the left lower lobe endobronchial tree on image 32 is likely due to secretions.  This area is patent on the 12/25/2010 exam.  Mild motion degradation throughout. Chronic interstitial thickening within the right lower lobe, without focal opacity. Left apical pleural parenchymal scarring.  Worsened left-sided aeration with volume loss in the left upper lobe and near complete collapse of the left lower lobe.  Soft tissue windows demonstrate aortic and coronary artery atherosclerosis.  Mild cardiomegaly.  Pulmonary artery enlargement.  Trace right-sided pleural fluid.  Left-sided pleural effusion is increased in size and moderate.  There is increased loculation laterally.  Areas of increased density within the pleural fluid (example image 26) are consistent with blood products.  These are new since 12/25/2010.  No mediastinal or definite hilar adenopathy, given limitations of unenhanced CT.  A small hiatal hernia.  Calcified lymph nodes in the left hilum.  Limited abdominal imaging demonstrates old granulomatous disease in the liver and spleen.  Upper pole left renal cyst.  Moderate osteopenia.  Severe compression deformity at L1 with mild canal compromise.  This is chronic.  Remote trauma within the sternum.  IMPRESSION: 1.  Increase in left-sided pleural effusion with development of complexity, highly suspicious for hemothorax. These results will be called to the ordering clinician or representative by the Radiologist Assistant, and communication documented in the PACS Dashboard. 2.  Secondary decreased aeration of the left lung with near complete collapse of the left lower lobe.  Soft tissue density within the left lower lobe endobronchial tree is favored to be related to secretions, given absence on  12/25/2010.  If re- expansion does not occur after thoracentesis, bronchoscopy may be indicated.  3.  Motion degraded exam. 4. Pulmonary artery enlargement suggests pulmonary arterial hypertension.  Original Report Authenticated By: Consuello Bossier, M.D.      Recent Lab Findings: Lab Results  Component Value Date   WBC 5.0 02/05/2011   HGB 10.2* 02/05/2011   HCT 30.4* 02/05/2011   PLT 145* 02/05/2011   GLUCOSE 79 02/05/2011   CHOL 166 10/28/2010   TRIG 44.0 10/28/2010   HDL 78.70 10/28/2010   LDLCALC 79 10/28/2010   ALT 10 02/04/2011   AST 22 02/04/2011   NA 138 02/05/2011   K 3.8 02/05/2011   CL 104 02/05/2011   CREATININE 0.81 02/05/2011   BUN 17 02/05/2011   CO2 26 02/05/2011   TSH 1.584 02/04/2011   INR 2.31* 02/05/2011      Assessment / Plan: I recommend no anticoagulation because of the risk of bleeding and fall. I will follow the loculated effusion with a chest x-ray serially. Please call us if the patient develops an increasing effusion or persistent temperature. We will see her again at your request.             Cameron Proud 02/05/2011 4:59 PM

## 2011-02-05 NOTE — Progress Notes (Addendum)
Subjective: Speaks very little.  Appears very weak and frail.    Objective: Weight change:   Intake/Output Summary (Last 24 hours) at 02/05/11 1027 Last data filed at 02/04/11 2142  Gross per 24 hour  Intake      3 ml  Output      0 ml  Net      3 ml   Blood pressure 131/71, pulse 73, temperature 98.3 F (36.8 C), temperature source Oral, resp. rate 20, height 5\' 10"  (1.778 m), weight 71.668 kg (158 lb), SpO2 97.00%. Filed Vitals:   02/04/11 1850 02/04/11 2155 02/05/11 0438  BP: 158/81 159/80 131/71  Pulse: 74 72 73  Temp: 98.8 F (37.1 C) 98.5 F (36.9 C) 98.3 F (36.8 C)  TempSrc: Oral Oral Oral  Resp: 20 20 20   Height:  5\' 10"  (1.778 m)   Weight:  71.668 kg (158 lb)   SpO2: 94% 94% 97%    Physical Exam: General: NAD, lying in bed.  Very frail. Short answers are appropriate. Lungs:  Very course breath sounds with poor air movement. Cardiovascular: Regular rate and rhythm without murmur gallop or rub normal S1 and S2 Abdomen: thin, Nontender, nondistended, soft, bowel sounds positive, no rebound, no ascites, no appreciable mass Extremities: No significant cyanosis, clubbing, or edema bilateral lower extremities   CT Chest 02/05/2012:  IMPRESSION: 1. Increase in left-sided pleural effusion with development of complexity, highly suspicious for hemothorax. 2. Secondary decreased aeration of the left lung with near complete collapse of the left lower lobe. Soft tissue density within the left lower lobe endobronchial tree is favored to be related to secretions, given absence on 12/25/2010. If re- expansion does not occur after thoracentesis, bronchoscopy may be indicated. 3. Motion degraded exam. 4. Pulmonary artery enlargement suggests pulmonary arterial hypertension.    Lab Results:  [facility] 02/05/11 0537 02/04/11 2015  NA 138 138  K 3.8 4.3  CL 104 102  CO2 26 26  GLUCOSE 79 88  BUN 17 20  CREATININE 0.81 0.83  CALCIUM 8.5 9.0  MG -- 2.1  PHOS -- --     Basename 02/04/11 2015  AST 22  ALT 10  ALKPHOS 70  BILITOT 0.4  PROT 7.4  ALBUMIN 3.5   No results found for this basename: LIPASE:2,AMYLASE:2 in the last 72 hours  Basename 02/05/11 0537 02/04/11 2015  WBC 3.7* 6.3  NEUTROABS -- --  HGB 10.2* 11.4*  HCT 30.2* 33.2*  MCV 97.1 96.8  PLT 140* 151   Basename 02/04/11 2015  TSH 1.584  T4TOTAL --  T3FREE --  THYROIDAB --     Micro Results:   Studies/Results: Scheduled Meds:   . Carbidopa  25 mg Oral TID  . carbidopa-levodopa-entacapone  1 tablet Oral TID  . moxifloxacin  400 mg Intravenous Q24H  . multivitamins ther. w/minerals  1 tablet Oral Daily  . rasagiline  1 mg Oral Daily  . sodium chloride  3 mL Intravenous Q12H  . traMADol-acetaminophen  2 tablet Oral QHS  . warfarin  5 mg Oral q1800  . DISCONTD: multivitamin  1 tablet Oral Daily   Continuous Infusions:  PRN Meds:.acetaminophen, acetaminophen, albuterol, ondansetron (ZOFRAN) IV, ondansetron  Assessment/Plan: Principal Problem:  *Cough Active Problems:  NEOPLASM, MALIGNANT, LUNG, NON-SMALL CELL  PARKINSON'S DISEASE  PE (pulmonary embolism)  Pleural effusion  History of CVA  1.  Probable Left hemothorax, with near complete collapse of Left lower lung lobe.  CTS Consulted at 10:30am.  Attempted to reach Carolin Coy (  Son) Left message for him to call the floor.  Need to update family & ascertain code status.   Appears hemodynamically stable.  2.  "Recent" PE currently on coumadin managed by Dr. Gwenyth Bouillon.  Will hold coumadin and discuss with attending.  Addendum:  I spoke with Dr. Gwenyth Bouillon who agreed to hold coumadin indefinitely and reverse if the patient was felt to be actively bleeding.  He will see the patient in-house 11/30.  3.  Non-Small Cell Lung Ca S/P radiation and chemo, managed by Dr. Gwenyth Bouillon.  4. History of CVA  5. Parkinson's Disease.  6.  PNA - Avelox started on admission.  Will leave in place for now.  Uncertain if there is  infection or if this SOB/Cough is mechanical.   LOS: 1 day   Christian Scott,Christian Scott 02/05/2011, 10:27 AM

## 2011-02-05 NOTE — Progress Notes (Signed)
ANTICOAGULATION CONSULT NOTE - Follow Up Consult  Pharmacy Consult for Coumadin Indication: pulmonary embolus  Allergies  Allergen Reactions  . Morphine And Related Other (See Comments)    hallucinations    Patient Measurements: Height: 5\' 10"  (177.8 cm) Weight: 158 lb (71.668 kg) IBW/kg (Calculated) : 73  Adjusted Body Weight:   Vital Signs: Temp: 98.3 F (36.8 C) (11/29 0438) Temp src: Oral (11/29 0438) BP: 131/71 mmHg (11/29 0438) Pulse Rate: 73  (11/29 0438)  Labs:  Basename 02/05/11 0537 02/04/11 2124 02/04/11 2015  HGB 10.2* -- 11.4*  HCT 30.2* -- 33.2*  PLT 140* -- 151  APTT -- -- --  LABPROT 25.8* 26.0* --  INR 2.31* 2.34* --  HEPARINUNFRC -- -- --  CREATININE 0.81 -- 0.83  CKTOTAL -- -- --  CKMB -- -- --  TROPONINI -- -- --   Estimated Creatinine Clearance: 63.9 ml/min (by C-G formula based on Cr of 0.81).  Medical History: Past Medical History  Diagnosis Date  . Parkinson's disease   . Macular degeneration, left eye     dry  . Pulmonary embolism 02/04/11    recent h/o  . Pneumonia 02/04/11    "here cause he may have pneumonia; never had it before"  . Stroke 1990's    "brought on his Parkinson's; drags right foot"  . Confusion 02/04/11    "he's getting worse"  . Pleural effusion 02/04/11    "today"  . Lung cancer 2006     S/P chemo & radiation  . Cancer     skin    Medications:  Prescriptions prior to admission  Medication Sig Dispense Refill  . Carbidopa (LODOSYN) 25 MG tablet Take 25 mg by mouth 3 (three) times daily.        . carbidopa-levodopa-entacapone (STALEVO) 25-100-200 MG per tablet Take 1 tablet by mouth 3 (three) times daily.        . Multiple Vitamin (MULTIVITAMIN) tablet Take 1 tablet by mouth daily.        . rasagiline (AZILECT) 0.5 MG TABS Take 1 mg by mouth daily.        . traMADol-acetaminophen (ULTRACET) 37.5-325 MG per tablet Take 2 tablets by mouth at bedtime.  60 tablet  3  . warfarin (COUMADIN) 5 MG tablet Take 5  mg by mouth daily.        Scheduled:     . Carbidopa  25 mg Oral TID  . carbidopa-levodopa-entacapone  1 tablet Oral TID  . moxifloxacin  400 mg Intravenous Q24H  . multivitamins ther. w/minerals  1 tablet Oral Daily  . rasagiline  1 mg Oral Daily  . sodium chloride  3 mL Intravenous Q12H  . traMADol-acetaminophen  2 tablet Oral QHS  . warfarin  5 mg Oral q1800  . DISCONTD: multivitamin  1 tablet Oral Daily    Assessment: Patient with hx of PE diagnosed 4/12 on Coumadin PTA. INR is at-goal.  Goal of Therapy:  INR 2-3   Plan: 1. Continue home regimen of Coumadin 5mg  po daily.  2. F/u AM INR.   Lorre Munroe, PharmD 02/05/2011 8:23 AM

## 2011-02-05 NOTE — Progress Notes (Signed)
77 year old man with progressive cough and fatigue. Chest x-ray revealed a moderate left-sided pleural effusion the patient was directly admitted to [facility] with concern for pneumonia. He was placed on empiric Avelox therapy and CT chest was performed. CT chest revealed increase in left-sided pleural effusion suspicious for hemothorax. CT surgery consultation is pending at this time.  History is provided by daughter and wife report no recent trauma (no falls, no accidents).  Past medical history: Notable for history of lung cancer, Parkinson's, and recent diagnosis of pulmonary embolism (seen on CT chest 12/25/2010 and CT chest 06/25/2010). The CT chest there were 18 described a similar appearance to previous CT in April.  Patient seen and examined. He remains afebrile and vital signs are stable. Respiratory rate and oxygenation are acceptable. He appears calm and comfortable. There are decreased breath sounds on the left side but his respiratory rate appears normal and respiratory rate is normal.  INR is elevated at 2.31, platelet count 140, hemoglobin today is 10.2.  Impression is as documented by Ms. York. Medical decision-making is complex in this patient. I had a long discussion with his wife who is his power of attorney and his daughter. It is not clear whether the patient truly has a hemothorax or not or what the etiology of that would be. His history of lung cancer was in the right lower lobe. CT of the chest from 02/13/2009 demonstrated chronic loculated left pleural effusion with collapse of the left lower lobe. However CT from today demonstrated an increase in the pleural effusion and development of complexity.  Given his clinical stability and the apparent stability of his hemoglobin at this point I doubt ongoing bleeding. In discussion with the family they would like surgical consultation for further evaluation and recommendations. They agree with Coumadin being held. I discussed  the risk/benefit of ongoing Coumadin administration versus reversal. Certainly there is significant risk either way given the possibility of hemothorax and his presumed pulmonary embolism. At this point the family wishes to continue conservative measures, daily chest x-rays and serial blood counts. They have not ruled out surgical intervention if recommended. I do not think there is an urgent need for intervention. I have also placed a call to Dr. Si Gaul to discuss the above and I await a call back.  The patient's power of attorney/wife states that the patient is DO NOT RESUSCITATE.

## 2011-02-06 ENCOUNTER — Inpatient Hospital Stay (HOSPITAL_COMMUNITY): Payer: Medicare Other

## 2011-02-06 LAB — BASIC METABOLIC PANEL
Calcium: 8.6 mg/dL (ref 8.4–10.5)
Creatinine, Ser: 0.89 mg/dL (ref 0.50–1.35)
GFR calc non Af Amer: 74 mL/min — ABNORMAL LOW (ref >90)
Glucose, Bld: 77 mg/dL (ref 70–99)
Sodium: 138 mEq/L (ref 135–145)

## 2011-02-06 LAB — CBC
MCH: 32.7 pg (ref 26.0–34.0)
MCV: 96.7 fL (ref 78.0–100.0)
Platelets: 143 10*3/uL — ABNORMAL LOW (ref 150–400)
RBC: 3.33 MIL/uL — ABNORMAL LOW (ref 4.22–5.81)
RDW: 12.9 % (ref 11.5–15.5)

## 2011-02-06 LAB — PROTIME-INR: INR: 1.96 — ABNORMAL HIGH (ref 0.00–1.49)

## 2011-02-06 MED ORDER — SENNOSIDES-DOCUSATE SODIUM 8.6-50 MG PO TABS
1.0000 | ORAL_TABLET | Freq: Every day | ORAL | Status: DC
  Administered 2011-02-06: 1 via ORAL
  Filled 2011-02-06: qty 1

## 2011-02-06 MED ORDER — MAGNESIUM HYDROXIDE 400 MG/5ML PO SUSP
15.0000 mL | ORAL | Status: DC
  Administered 2011-02-06 – 2011-02-07 (×2): 15 mL via ORAL
  Filled 2011-02-06 (×2): qty 30

## 2011-02-06 MED ORDER — MOXIFLOXACIN HCL 400 MG PO TABS
400.0000 mg | ORAL_TABLET | Freq: Every day | ORAL | Status: DC
  Administered 2011-02-06: 400 mg via ORAL
  Filled 2011-02-06 (×2): qty 1

## 2011-02-06 NOTE — Progress Notes (Signed)
Patient seen and examined. Data reviewed.  Overall the patient appears quite a bit better today. Process appears to be stable. He has no complaints. There are decreased breath sounds in the left side, without significant change. Respiratory effort appears normal.  Principal problem is a left pleural effusion, increased, possible hemothorax. Hemoglobin remained stable. There is no evidence to suggest ongoing bleeding. In discussion with Dr. Arbutus Ped yesterday the decision was made to discontinue his Coumadin indefinitely. He may additionally have pneumonia which is difficult to rule out. He will continue on Avelox.  I appreciate cardiothoracic surgery's evaluation and recommendations. I have spent time with the patient's wife and daughter this morning reviewing the chest x-ray findings with them. At this point they do not wish to pursue aggressive measures including thoracentesis or surgery. The plan at this point is to mobilize with physical therapy and reevaluate the patient in the morning. If he remains clinically stable would anticipate discharge home tomorrow December 1.  I agree with the exam, assessment and plan of Ms. York except as above.

## 2011-02-06 NOTE — Progress Notes (Signed)
02/06/2011 Christian Scott SPARKS Case Management Note [phone]       Utilization review completed.  

## 2011-02-06 NOTE — Progress Notes (Signed)
Subjective: I feel better. I want to go home.  Objective: Weight change:   Intake/Output Summary (Last 24 hours) at 02/06/11 0925 Last data filed at 02/05/11 2300  Gross per 24 hour  Intake   1213 ml  Output   1025 ml  Net    188 ml   Blood pressure 145/84, pulse 53, temperature 97.5 F (36.4 C), temperature source Oral, resp. rate 21, height 5\' 10"  (1.778 m), weight 71.668 kg (158 lb), SpO2 93.00%. Filed Vitals:   02/05/11 0438 02/05/11 1411 02/05/11 2013 02/06/11 0500  BP: 131/71 106/59 128/74 145/84  Pulse: 73 60 57 53  Temp: 98.3 F (36.8 C) 97.7 F (36.5 C) 97.6 F (36.4 C) 97.5 F (36.4 C)  TempSrc: Oral Oral Oral Oral  Resp: 20 20 20 21   Height:      Weight:      SpO2: 97% 81% 94% 93%    Physical Exam: General: No acute distress.  Sitting up eating breakfast.  Speaking much more clearly today. Lungs: Left side has diminished breath sounds.  Coarse sounds bilaterally. Cardiovascular: Regular rate and rhythm without murmur gallop or rub  Abdomen: Nontender, nondistended, soft, bowel sounds positive, no rebound, no ascites, no appreciable mass Extremities: No significant cyanosis, clubbing, or edema bilateral lower extremities Left Ear:  Small lesion on auricle with slight bloody drainage.  Basic Metabolic Panel:  Lab 02/06/11 9147 02/05/11 0537 02/04/11 2015  NA 138 138 --  K 4.1 3.8 --  CL 103 104 --  CO2 26 26 --  GLUCOSE 77 79 --  BUN 17 17 --  CREATININE 0.89 0.81 --  CALCIUM 8.6 8.5 --  MG -- -- 2.1  PHOS -- -- --   Liver Function Tests:  Lab 02/04/11 2015  AST 22  ALT 10  ALKPHOS 70  BILITOT 0.4  PROT 7.4  ALBUMIN 3.5   CBC:  Lab 02/06/11 0527 02/05/11 1553  WBC 4.2 5.0  NEUTROABS -- --  HGB 10.9* 10.2*  HCT 32.2* 30.4*  MCV 96.7 97.1  PLT 143* 145*   Thyroid Function Tests:  Lab 02/04/11 2015  TSH 1.584  T4TOTAL --  FREET4 --  T3FREE --  THYROIDAB --   Coagulation:  Lab 02/06/11 0527 02/05/11 0537 02/04/11 2124  LABPROT  22.7* 25.8* 26.0*  INR 1.96* 2.31* 2.34*    Studies/Results:  02/06/11  2V CXR:  IMPRESSION:  1. Stable to slight increase in a large left pleural effusion and associated airspace disease. 2. Slight improvement in aeration of the right lung base with a small right pleural effusion. 3. Calcifications at the left lung base are stable.  Consultations:    1.  CTS:  I would follow this effusion with serial chest x-rays. I would not recommend any further treatment such as chest tube or thoracentesis. If this is a hemothorax it should partially resolve with time. The only mild.problem is that it might become infected. At that time we may have to consider drainage.  We appreciate Dr. Scheryl Darter recommendations.  2.  Oncology:  It is anticipated that Dr. Gwenyth Bouillon will come by 02/06/11.   Scheduled Meds:   . Carbidopa  25 mg Oral TID  . carbidopa-levodopa-entacapone  1 tablet Oral TID  . moxifloxacin  400 mg Intravenous Q24H  . multivitamins ther. w/minerals  1 tablet Oral Daily  . rasagiline  1 mg Oral Daily  . sodium chloride  3 mL Intravenous Q12H  . traMADol-acetaminophen  2 tablet Oral QHS  . white  petrolatum      . DISCONTD: warfarin  5 mg Oral q1800   Continuous Infusions:  PRN Meds:.acetaminophen, acetaminophen, albuterol, ondansetron (ZOFRAN) IV, ondansetron  Assessment/Plan: Principal Problem:  *Cough Active Problems:  NEOPLASM, MALIGNANT, LUNG, NON-SMALL CELL  PARKINSON'S DISEASE  PE (pulmonary embolism)  Pleural effusion  1. Probable Left hemothorax, with near complete collapse of Left lower lung lobe.  CTS recommends no drainage or chest tube unless the patient develops infection.  2. "Recent" PE.  Felt to be an incidental finding.  Anticoagulation discontinued.   3. Non-Small Cell Lung Ca S/P radiation and chemo, managed by Dr. Gwenyth Bouillon.   4. History of CVA   5. Parkinson's Disease.   6. PNA - Avelox started on admission. Will leave in place for now. Uncertain  if there is infection or if this SOB/Cough is mechanical.  7.  Patient requesting discharge to home.  I will discuss this with my attending but I believe he needs a little more time here.  In preparation for DC I will ask PT/OT to evaluate and consult case management.   LOS: 2 days   YORK,Maria Gallicchio L 02/06/2011, 9:25 AM

## 2011-02-07 ENCOUNTER — Inpatient Hospital Stay (HOSPITAL_COMMUNITY): Payer: Medicare Other

## 2011-02-07 DIAGNOSIS — J942 Hemothorax: Secondary | ICD-10-CM | POA: Diagnosis present

## 2011-02-07 DIAGNOSIS — J189 Pneumonia, unspecified organism: Secondary | ICD-10-CM | POA: Diagnosis present

## 2011-02-07 LAB — CBC
MCH: 33.2 pg (ref 26.0–34.0)
MCV: 96.9 fL (ref 78.0–100.0)
WBC: 5.1 10*3/uL (ref 4.0–10.5)

## 2011-02-07 MED ORDER — MOXIFLOXACIN HCL 400 MG PO TABS: 400.0000 mg | ORAL_TABLET | Freq: Every day | ORAL | Status: AC

## 2011-02-07 NOTE — Progress Notes (Signed)
Christian Scott to be D/C'd Home per MD order.  Discharge instructions reviewed and discussed with the patient, all questions and concerns answered. Copy of instructions and scripts given to patient.   [name], [name]  Home Medication Instructions EAV:[id number]   Printed on:02/07/11 1501  Medication Information                    Multiple Vitamin (MULTIVITAMIN) tablet Take 1 tablet by mouth daily.             carbidopa-levodopa-entacapone (STALEVO) 25-100-200 MG per tablet Take 1 tablet by mouth 3 (three) times daily.             rasagiline (AZILECT) 0.5 MG TABS Take 1 mg by mouth daily.             Carbidopa (LODOSYN) 25 MG tablet Take 25 mg by mouth 3 (three) times daily.             traMADol-acetaminophen (ULTRACET) 37.5-325 MG per tablet Take 2 tablets by mouth at bedtime.           moxifloxacin (AVELOX) 400 MG tablet Take 1 tablet (400 mg total) by mouth daily at 6 PM.             Patients skin is clean, dry and intact no evidence of skin break down. IV site discontinued and catheter remains intact. Site without signs and symptoms of complications. Dressing and pressure applied.  Patient escorted to car by NT in a wheelchair,  no distress noted upon discharge.  Julien Nordmann [facility] 02/07/2011 3:01 PM

## 2011-02-07 NOTE — Progress Notes (Signed)
Contacted [facility] for [facility] PT for scheduled d/c today. Wife states pt does not need any DME at this time.

## 2011-02-07 NOTE — Progress Notes (Signed)
Physical Therapy Evaluation Patient Details Name: Christian Scott MRN: [id number] DOB: [birth year] Today's Date: 02/07/2011  Problem List:  Patient Active Problem List  Diagnoses  . NEOPLASM, MALIGNANT, LUNG, NON-SMALL CELL  . PARKINSON'S DISEASE  . CVA  . DIVERTICULOSIS, COLON  . ARTHRITIS  . Irregular heart beat  . PE (pulmonary embolism)  . Pleural effusion  . Cough    Past Medical History:  Past Medical History  Diagnosis Date  . Parkinson's disease   . Macular degeneration, left eye     dry  . Pulmonary embolism 02/04/11    recent h/o  . Pneumonia 02/04/11    "here cause he may have pneumonia; never had it before"  . Stroke 1990's    "brought on his Parkinson's; drags right foot"  . Confusion 02/04/11    "he's getting worse"  . Pleural effusion 02/04/11    "today"  . Lung cancer 2006     S/P chemo & radiation  . Cancer     skin   Past Surgical History:  Past Surgical History  Procedure Date  . Scalp surgery ~ 2008    S/P skin cancer excision  . Hip surgery ~ 2008    left; replacement  . Knee surgery     right; replacement  . Joint replacement   . Refractive surgery 1990's    bilaterally    PT Assessment/Plan/Recommendation PT Assessment Clinical Impression Statement: Pt with some difficulties as a historian so spoke with daughter. Daughter reports pt well equipt, pt's wife will be with pt 24 hours/day, and she lives <5 min away and checks in frequently. PT educated daughter and pt on pt's increased risk of falls given excessive toe walking and festinating gait in addition to decreased cognition. Both verbalize understanding and daughter and pt's wife feel comfortable taking pt home. Discussed pros/cons of SNF, pt nor daughter feel that will be beneficial. PT recommending HHPT to  help decrease risk of falls in the home with supervision with ambulation.  PT Recommendation/Assessment: Patient will need skilled PT in the acute care venue PT Problem List:  Decreased strength;Decreased range of motion;Decreased activity tolerance;Decreased mobility;Decreased balance;Decreased cognition;Decreased coordination;Decreased knowledge of use of DME PT Therapy Diagnosis : Difficulty walking;Abnormality of gait;Generalized weakness;Altered mental status PT Plan PT Frequency: Min 3X/week PT Treatment/Interventions: DME instruction;Gait training;Functional mobility training;Therapeutic activities;Therapeutic exercise;Balance training;Neuromuscular re-education;Patient/family education PT Recommendation Follow Up Recommendations: Home health PT;24 hour supervision/assistance Equipment Recommended: None recommended by PT PT Goals  Acute Rehab PT Goals PT Goal Formulation: With patient Time For Goal Achievement: 2 weeks Pt will go Sit to Stand: with modified independence PT Goal: Sit to Stand - Progress: Progressing toward goal Pt will go Stand to Sit: with modified independence PT Goal: Stand to Sit - Progress: Progressing toward goal Pt will Transfer Bed to Chair/Chair to Bed: with modified independence PT Transfer Goal: Bed to Chair/Chair to Bed - Progress: Progressing toward goal Pt will Ambulate: >150 feet PT Goal: Ambulate - Progress: Progressing toward goal Pt will Perform Home Exercise Program: with supervision, verbal cues required/provided PT Goal: Perform Home Exercise Program - Progress: Progressing toward goal  PT Evaluation Precautions/Restrictions    Prior Functioning  Home Living Lives With: Spouse Receives Help From: Family Type of Home: Apartment Home Layout: One level Home Access: Level entry Bathroom Shower/Tub: Walk-in shower (rails on both sides) Bathroom Toilet: Handicapped height (also uses 3n1) Bathroom Accessibility: Yes How Accessible: Accessible via wheelchair;Accessible via walker Home Adaptive Equipment: Bedside commode/3-in-1;Wheelchair - manual;Wheelchair -  powered;Walker - rolling (railing by side on  bed) Additional Comments: Handicap accessible condo per daughter Prior Function Level of Independence: Requires assistive device for independence (just started needing assistance with some dressing) Driving: No Vocation: Retired Comments: Pt has been able to bath and ambulate on his own with use of RW, shower chair, and toilet with the 3n1.  Cognition Cognition Orientation Level: Oriented to person;Disoriented to time Cognition - Other Comments: Decreased memory and recall of living situation as compared to daughter's history.  Sensation/Coordination   Extremity Assessment RLE Assessment RLE Assessment: Exceptions to [facility] RLE AROM (degrees) RLE Overall AROM Comments: Pt with decreased dorsiflexion, knee extension, and hip extension particularly noticable with gait.  RLE Strength RLE Overall Strength: Deficits RLE Overall Strength Comments: Generalized deconditioning, grossly >/= 3/5 LLE Assessment LLE Assessment: Exceptions to WFL LLE AROM (degrees) LLE Overall AROM Comments: Pt with decreased dorsiflexion, knee extension, and hip extension particularly noticable with gait.  LLE Strength LLE Overall Strength: Deficits LLE Overall Strength Comments: Generalized deconditioning, grossly >/= 3/5 Mobility (including Balance) Bed Mobility Bed Mobility: Yes Supine to Sit: 5: Supervision Supine to Sit Details (indicate cue type and reason): Verbal cues for efficiency. Requires increased time Sitting - Scoot to Edge of Bed: 5: Supervision Sitting - Scoot to Edge of Bed Details (indicate cue type and reason): Verbal cues for initiation Transfers Transfers: Yes Sit to Stand: 5: Supervision;From elevated surface;From bed;With armrests Sit to Stand Details (indicate cue type and reason): Very slow ascent, required bed to be elevated to self reported level of bed at home.  Stand to Sit: 5: Supervision Stand to Sit Details: Pt uses armrests safely with out cuing, needs supervision for safety.   Ambulation/Gait Ambulation/Gait: Yes Ambulation/Gait Assistance: 5: Supervision Ambulation/Gait Assistance Details (indicate cue type and reason): min-guard assist secondary to unstable gait secondary to festination and toe walking. Verbal cues for sequencing with turns.  Ambulation Distance (Feet): 65 Feet Assistive device: Rolling walker Gait Pattern: Shuffle;Festinating;Decreased step length - right;Decreased step length - left;Decreased stride length;Trunk flexed (Decreased foot clearance bil, increased hip/knee flexion)  Posture/Postural Control Posture/Postural Control: Postural limitations Postural Limitations: Significant kyphosis and forward posture with gait Balance Balance Assessed: Yes Static Sitting Balance Static Sitting - Balance Support: No upper extremity supported;Feet supported Static Sitting - Level of Assistance: 5: Stand by assistance Static Standing Balance Static Standing - Balance Support: Bilateral upper extremity supported;Right upper extremity supported Static Standing - Level of Assistance: 5: Stand by assistance Dynamic Standing Balance Dynamic Standing - Comments: Pt able to reach with one upper extremity however requires one hand on RW at all times.  End of Session PT - End of Session Equipment Utilized During Treatment: Gait belt Activity Tolerance: Patient tolerated treatment well Patient left: in chair;with call bell in reach (nursing tech present) Nurse Communication: Mobility status for transfers;Mobility status for ambulation General Behavior During Session: [facility] for tasks performed Cognition: Impaired, at baseline  Sherie Don 02/07/2011, 9:55 AM  Sherie Don) Carleene Mains PT, DPT Acute Rehabilitation [phone]

## 2011-02-07 NOTE — Discharge Summary (Signed)
Physician Discharge Summary  Christian Scott GEX:[id number] DOB: [birth year] DOA: 02/04/2011  PCP: Neena Rhymes, MD, MD  Admit date: 02/04/2011 Discharge date: 02/07/2011  Discharge Diagnoses:  1. Left pleural effusion, acute/chronic 2. Possible left hemothorax 3. Possible pneumonia 4. History of pulmonary embolism 5. History of lung cancer  Discharge Condition: Improved  Disposition: Home with home health physical therapy  History of present illness:  77 year old man with progressive cough and fatigue. Chest x-ray revealed a moderate left-sided pleural effusion the patient was directly admitted to [facility] with concern for pneumonia. He was placed on empiric Avelox therapy and CT chest was performed. CT chest revealed increase in left-sided pleural effusion suspicious for hemothorax. CT surgery consultation was obtained.  Hospital Course:  Mr. Tornow was admitted to the medical floor. His chest CT demonstrated increased size of the pleural effusion with concern for hemothorax. His respiratory status remained stable and his oxygenation remains normal on room air. CBC remained stable and there is no evidence of ongoing blood loss. There is no history of trauma. Cardiothoracic surgery consultation was obtained but given the patient's advanced age and comorbidities no intervention was recommended. This was discussed at length with the patient's family including his daughter and his wife. I reviewed the patient's serial chest x-rays over the last 3 years with them in detail. He has had a progressive increase in the loculated left pleural effusion. This is likely to worsen over time and may precipitate acute respiratory failure at some point. However because of its chronicity and loculated appearance is not likely amenable to simple thoracentesis. The family wishes to pursue conservative measures and does not desire surgery or thoracentesis at this point. They are aware that this is  likely to worsen over time.  The patient had been started on Coumadin in October of this year for presumed pulmonary embolism. The patient's care was discussed with his oncologist Dr. Si Gaul. Dr. Arbutus Ped recommended discontinuing the patient's Coumadin indefinitely.  Repeat chest x-ray obtained today which I have independently reviewed. Chronic left pleural effusion persists. No significant change. Formal radiologic interpretation is pending at this time. 1. Left pleural effusion, chronic: Slowly progressing over the last 2 years but appears be stable at this time.  2. Possible left hemothorax: Suspected to be old. No evidence of ongoing blood loss. No intervention desired. Appreciate cardiothoracic surgery recommendations.  3. Possible pneumonia: This is doubted. Stable oxygenation and respiratory rate. Complete Avelox as an outpatient.  4. History of pulmonary embolism: Coumadin discontinued per Dr. Asa Lente recommendation.  5. History of lung cancer  Consultants:  Oncology by telephone. Formal consultation was requested but was unable to be obtained.  Cardiothoracic surgery   Physical therapy: Home health physical therapy recommended. No equipment recommended.  Procedures:  None  Discharge Exam:  Filed Vitals:   02/06/11 0500 02/06/11 1439 02/06/11 2100 02/07/11 0500  BP: 145/84 145/84 112/58 136/75  Pulse: 53 57 59 53  Temp: 97.5 F (36.4 C) 97.3 F (36.3 C) 98.3 F (36.8 C) 97.4 F (36.3 C)  TempSrc: Oral Oral Oral Oral  Resp: 21 20 20 18   Height:      Weight:      SpO2: 93% 93% 97% 93%   Discharge Instructions  Discharge Orders    Future Appointments: Provider: Department: Dept Phone: Center:   02/09/2011 2:00 PM Stephanie Acre [facility] [phone] None   02/09/2011 2:15 PM [facility] [id number] None   06/26/2011 9:30 AM [facility] [phone] [place]     Future Orders Please Complete By Expires   Diet - low sodium  heart healthy      Increase activity slowly      Discharge instructions      Comments:   Call physician for shortness of breath.     Current Discharge Medication List    START taking these medications   Details  moxifloxacin (AVELOX) 400 MG tablet Take 1 tablet (400 mg total) by mouth daily at 6 PM. Qty: 4 tablet, Refills: 0      CONTINUE these medications which have NOT CHANGED   Details  Carbidopa (LODOSYN) 25 MG tablet Take 25 mg by mouth 3 (three) times daily.      carbidopa-levodopa-entacapone (STALEVO) 25-100-200 MG per tablet Take 1 tablet by mouth 3 (three) times daily.      Multiple Vitamin (MULTIVITAMIN) tablet Take 1 tablet by mouth daily.      rasagiline (AZILECT) 0.5 MG TABS Take 1 mg by mouth daily.      traMADol-acetaminophen (ULTRACET) 37.5-325 MG per tablet Take 2 tablets by mouth at bedtime. Qty: 60 tablet, Refills: 3      STOP taking these medications     warfarin (COUMADIN) 5 MG tablet        Follow-up Information    Follow up with Neena Rhymes, MD in 1 week.   Contact information:   [phone] [place] [address] [phone]       Follow up with Lajuana Matte., MD. (As needed)    Contact information:   [address] [place] [address] [phone]          The results of significant diagnostics from this hospitalization (including imaging, microbiology, ancillary and laboratory) are listed below for reference.    Significant Diagnostic Studies: Dg Chest 2 View  02/06/2011  *RADIOLOGY REPORT*  Clinical Data: Follow-up left pleural effusion.  Shortness of breath and weakness.  CHEST - 2 VIEW  Comparison: CT chest of 02/05/2011.  Two-view chest radiograph 02/04/2011. Marland Kitchen  Findings:  The left pleural effusion is stable to slightly increased.  The lung base obscured by metal artifact on the lateral view.  Aeration at the right lung bases slightly improved with residual  linear atelectasis.  A small pleural effusion is evident. Calcifications at the right lung base are stable  IMPRESSION:  1.  Stable to slight increase in a large left pleural effusion and associated airspace disease. 2.  Slight improvement in aeration of the right lung base with a small right pleural effusion. 3.  Calcifications at the left lung base are stable.  Original Report Authenticated By: Jamesetta Orleans. MATTERN, M.D.   Dg Chest 2 View  02/04/2011  *RADIOLOGY REPORT*  Clinical Data: Lung cancer with congestion and cough.  CHEST - 2 VIEW  Comparison: CT chest 12/25/2010.  Chest radiograph 02/26/2009.  Findings: Trachea is midline.  Heart size stable.  Thoracic aorta is calcified. There is dense soft tissue in the region of the left hilum but no corresponding adenopathy and 12/25/2010.  There is a moderate left pleural effusion, with some loculation.  Left basilar airspace disease.  Linear scarring at the right lung base.  Lower thoracic or upper lumbar compression fracture is stable.  IMPRESSION:  1.  Moderate left pleural effusion appears partially loculated. 2.  Left basilar airspace disease, some or all of which may be chronic.  Original Report Authenticated By: Reyes Ivan,  M.D.   Ct Chest Wo Contrast  02/05/2011  *RADIOLOGY REPORT*  Clinical Data: Short of breath and cough.  Evaluate pleural effusion.  History of lung cancer in 2006.  Radiation therapy and chemotherapy complete.  CT CHEST WITHOUT CONTRAST  Technique:  Multidetector CT imaging of the chest was performed following the standard protocol without IV contrast.  Comparison: Plain film of 02/04/2011.  CT of 12/25/2010.  Findings: Lung windows demonstrate soft tissue density within the left lower lobe endobronchial tree on image 32 is likely due to secretions.  This area is patent on the 12/25/2010 exam.  Mild motion degradation throughout. Chronic interstitial thickening within the right lower lobe, without focal opacity. Left  apical pleural parenchymal scarring.  Worsened left-sided aeration with volume loss in the left upper lobe and near complete collapse of the left lower lobe.  Soft tissue windows demonstrate aortic and coronary artery atherosclerosis.  Mild cardiomegaly.  Pulmonary artery enlargement.  Trace right-sided pleural fluid.  Left-sided pleural effusion is increased in size and moderate.  There is increased loculation laterally.  Areas of increased density within the pleural fluid (example image 26) are consistent with blood products.  These are new since 12/25/2010.  No mediastinal or definite hilar adenopathy, given limitations of unenhanced CT.  A small hiatal hernia.  Calcified lymph nodes in the left hilum.  Limited abdominal imaging demonstrates old granulomatous disease in the liver and spleen.  Upper pole left renal cyst.  Moderate osteopenia.  Severe compression deformity at L1 with mild canal compromise.  This is chronic.  Remote trauma within the sternum.  IMPRESSION: 1.  Increase in left-sided pleural effusion with development of complexity, highly suspicious for hemothorax. These results will be called to the ordering clinician or representative by the Radiologist Assistant, and communication documented in the PACS Dashboard. 2.  Secondary decreased aeration of the left lung with near complete collapse of the left lower lobe.  Soft tissue density within the left lower lobe endobronchial tree is favored to be related to secretions, given absence on 12/25/2010.  If re- expansion does not occur after thoracentesis, bronchoscopy may be indicated.  3.  Motion degraded exam. 4. Pulmonary artery enlargement suggests pulmonary arterial hypertension.  Original Report Authenticated By: Consuello Bossier, M.D.   Labs: Basic Metabolic Panel:  Lab 02/06/11 9528 02/05/11 0537 02/04/11 2015  NA 138 138 138  K 4.1 3.8 --  CL 103 104 102  CO2 26 26 26   GLUCOSE 77 79 88  BUN 17 17 20   CREATININE 0.89 0.81 0.83    CALCIUM 8.6 8.5 9.0  MG -- -- 2.1  PHOS -- -- --   Liver Function Tests:  Lab 02/04/11 2015  AST 22  ALT 10  ALKPHOS 70  BILITOT 0.4  PROT 7.4  ALBUMIN 3.5   CBC:  Lab 02/07/11 0730 02/06/11 0527 02/05/11 1553 02/05/11 0537 02/04/11 2015  WBC 5.1 4.2 5.0 3.7* 6.3  NEUTROABS -- -- -- -- --  HGB 11.9* 10.9* 10.2* 10.2* 11.4*  HCT 34.7* 32.2* 30.4* 30.2* 33.2*  MCV 96.9 96.7 97.1 97.1 96.8  PLT 147* 143* 145* 140* 151   Time coordinating discharge: 25 minutes.  Signed:  Brendia Sacks, MD  [facility] 02/07/2011, 1:40 PM

## 2011-02-07 NOTE — Progress Notes (Signed)
PROGRESS NOTE  Christian Scott:[id number] DOB: 1920/07/25 DOA: 02/04/2011 PCP: Neena Rhymes, MD, MD  Brief narrative: 77 year old man with progressive cough and fatigue. Chest x-ray revealed a moderate left-sided pleural effusion the patient was directly admitted to [facility] with concern for pneumonia. He was placed on empiric Avelox therapy and CT chest was performed. CT chest revealed increase in left-sided pleural effusion suspicious for hemothorax. CT surgery consultation is pending at this time.  Past medical history: Notable for history of lung cancer, Parkinson's, and recent diagnosis of pulmonary embolism (seen on CT chest 12/25/2010 and CT chest 06/25/2010). The CT chest there were 18 described a similar appearance to previous CT in April.  Consultants:  Oncology  Cardiothoracic surgery  Physical therapy: Home health physical therapy recommended. No equipment recommended.  Procedures:  None  Interim History: Interim documentation review. Seen by physical therapy: home health recommended. Subjective: No complaints.  Objective: Filed Vitals:   02/07/11 0500  BP: 136/75  Pulse: 53  Temp: 97.4 F (36.3 C)  Resp: 18   Blood pressure 136/75, pulse 53, temperature 97.4 F (36.3 C), temperature source Oral, resp. rate 18, height 5\' 10"  (1.778 m), weight 71.668 kg (158 lb), SpO2 93.00%. Filed Vitals:   02/06/11 0500 02/06/11 1439 02/06/11 2100 02/07/11 0500  BP: 145/84 145/84 112/58 136/75  Pulse: 53 57 59 53  Temp: 97.5 F (36.4 C) 97.3 F (36.3 C) 98.3 F (36.8 C) 97.4 F (36.3 C)  TempSrc: Oral Oral Oral Oral  Resp: 21 20 20 18   Height:      Weight:      SpO2: 93% 93% 97% 93%     Intake/Output Summary (Last 24 hours) at 02/07/11 1334 Last data filed at 02/07/11 0900  Gross per 24 hour  Intake    603 ml  Output    775 ml  Net   -172 ml    Exam: General: Appears well Cardiovascular: Regular rate and rhythm. No murmur, rub or  gallop. Respiratory: Clear to auscultation on the right without wheezes, rales or rhonchi. Decreased breath sounds on the left, no change. Respiratory effort appears normal.  Data Reviewed: Basic Metabolic Panel:  Lab 02/06/11 2952 02/05/11 0537 02/04/11 2015  NA 138 138 138  K 4.1 3.8 --  CL 103 104 102  CO2 26 26 26   GLUCOSE 77 79 88  BUN 17 17 20   CREATININE 0.89 0.81 0.83  CALCIUM 8.6 8.5 9.0  MG -- -- 2.1  PHOS -- -- --   Liver Function Tests:  Lab 02/04/11 2015  AST 22  ALT 10  ALKPHOS 70  BILITOT 0.4  PROT 7.4  ALBUMIN 3.5   CBC:  Lab 02/07/11 0730 02/06/11 0527 02/05/11 1553 02/05/11 0537 02/04/11 2015  WBC 5.1 4.2 5.0 3.7* 6.3  NEUTROABS -- -- -- -- --  HGB 11.9* 10.9* 10.2* 10.2* 11.4*  HCT 34.7* 32.2* 30.4* 30.2* 33.2*  MCV 96.9 96.7 97.1 97.1 96.8  PLT 147* 143* 145* 140* 151   Studies: Dg Chest 2 View  02/06/2011  *RADIOLOGY REPORT*  Clinical Data: Follow-up left pleural effusion.  Shortness of breath and weakness.  CHEST - 2 VIEW  Comparison: CT chest of 02/05/2011.  Two-view chest radiograph 02/04/2011. Marland Kitchen  Findings:  The left pleural effusion is stable to slightly increased.  The lung base obscured by metal artifact on the lateral view.  Aeration at the right lung bases slightly improved with residual linear atelectasis.  A small pleural effusion is  evident. Calcifications at the right lung base are stable  IMPRESSION:  1.  Stable to slight increase in a large left pleural effusion and associated airspace disease. 2.  Slight improvement in aeration of the right lung base with a small right pleural effusion. 3.  Calcifications at the left lung base are stable.  Original Report Authenticated By: Jamesetta Orleans. MATTERN, M.D.   Dg Chest 2 View  02/04/2011  *RADIOLOGY REPORT*  Clinical Data: Lung cancer with congestion and cough.  CHEST - 2 VIEW  Comparison: CT chest 12/25/2010.  Chest radiograph 02/26/2009.  Findings: Trachea is midline.  Heart size stable.   Thoracic aorta is calcified. There is dense soft tissue in the region of the left hilum but no corresponding adenopathy and 12/25/2010.  There is a moderate left pleural effusion, with some loculation.  Left basilar airspace disease.  Linear scarring at the right lung base.  Lower thoracic or upper lumbar compression fracture is stable.  IMPRESSION:  1.  Moderate left pleural effusion appears partially loculated. 2.  Left basilar airspace disease, some or all of which may be chronic.  Original Report Authenticated By: Reyes Ivan, M.D.   Ct Chest Wo Contrast  02/05/2011  *RADIOLOGY REPORT*  Clinical Data: Short of breath and cough.  Evaluate pleural effusion.  History of lung cancer in 2006.  Radiation therapy and chemotherapy complete.  CT CHEST WITHOUT CONTRAST  Technique:  Multidetector CT imaging of the chest was performed following the standard protocol without IV contrast.  Comparison: Plain film of 02/04/2011.  CT of 12/25/2010.  Findings: Lung windows demonstrate soft tissue density within the left lower lobe endobronchial tree on image 32 is likely due to secretions.  This area is patent on the 12/25/2010 exam.  Mild motion degradation throughout. Chronic interstitial thickening within the right lower lobe, without focal opacity. Left apical pleural parenchymal scarring.  Worsened left-sided aeration with volume loss in the left upper lobe and near complete collapse of the left lower lobe.  Soft tissue windows demonstrate aortic and coronary artery atherosclerosis.  Mild cardiomegaly.  Pulmonary artery enlargement.  Trace right-sided pleural fluid.  Left-sided pleural effusion is increased in size and moderate.  There is increased loculation laterally.  Areas of increased density within the pleural fluid (example image 26) are consistent with blood products.  These are new since 12/25/2010.  No mediastinal or definite hilar adenopathy, given limitations of unenhanced CT.  A small hiatal hernia.   Calcified lymph nodes in the left hilum.  Limited abdominal imaging demonstrates old granulomatous disease in the liver and spleen.  Upper pole left renal cyst.  Moderate osteopenia.  Severe compression deformity at L1 with mild canal compromise.  This is chronic.  Remote trauma within the sternum.  IMPRESSION: 1.  Increase in left-sided pleural effusion with development of complexity, highly suspicious for hemothorax. These results will be called to the ordering clinician or representative by the Radiologist Assistant, and communication documented in the PACS Dashboard. 2.  Secondary decreased aeration of the left lung with near complete collapse of the left lower lobe.  Soft tissue density within the left lower lobe endobronchial tree is favored to be related to secretions, given absence on 12/25/2010.  If re- expansion does not occur after thoracentesis, bronchoscopy may be indicated.  3.  Motion degraded exam. 4. Pulmonary artery enlargement suggests pulmonary arterial hypertension.  Original Report Authenticated By: Consuello Bossier, M.D.   Scheduled Meds:   . Carbidopa  25 mg Oral TID  .  carbidopa-levodopa-entacapone  1 tablet Oral TID  . magnesium hydroxide  15 mL Oral 1 day or 1 dose  . moxifloxacin  400 mg Oral q1800  . multivitamins ther. w/minerals  1 tablet Oral Daily  . rasagiline  1 mg Oral Daily  . senna-docusate  1 tablet Oral QHS  . sodium chloride  3 mL Intravenous Q12H  . traMADol-acetaminophen  2 tablet Oral QHS   Continuous Infusions:    Assessment/Plan: 1. Left pleural effusion, chronic: Slowly progressing over the last 2 years but appears be stable at this time. 2. Possible left hemothorax: Suspected to be old. No evidence of ongoing blood loss. No intervention desired. Appreciate cardiothoracic surgery recommendations. 3. Possible pneumonia: Stable oxygenation and respiratory rate. Complete Avelox as an outpatient. 4. History of pulmonary embolism: Coumadin discontinued per  Dr. Asa Lente recommendation. 5. History of lung cancer  Code Status: DO NOT RESUSCITATE Family Communication: Discussed disposition with wife today at bedside. Disposition Plan: Home today with home health physical therapy.   Brendia Sacks, MD  [facility] Pager [phone] 02/07/2011, 1:34 PM    LOS: 3 days

## 2011-02-09 ENCOUNTER — Ambulatory Visit: Payer: Medicare Other

## 2011-02-09 ENCOUNTER — Other Ambulatory Visit: Payer: Medicare Other | Admitting: Lab

## 2011-02-10 ENCOUNTER — Telehealth: Payer: Self-pay | Admitting: Oncology

## 2011-02-10 NOTE — Telephone Encounter (Signed)
Attempted to call patient to reschedule [facility]in [facility] appmt missed on Mon, 12/3.

## 2011-02-13 ENCOUNTER — Ambulatory Visit (INDEPENDENT_AMBULATORY_CARE_PROVIDER_SITE_OTHER): Payer: Medicare Other | Admitting: Family Medicine

## 2011-02-13 ENCOUNTER — Telehealth: Payer: Self-pay | Admitting: *Deleted

## 2011-02-13 ENCOUNTER — Encounter: Payer: Self-pay | Admitting: Family Medicine

## 2011-02-13 ENCOUNTER — Ambulatory Visit (HOSPITAL_BASED_OUTPATIENT_CLINIC_OR_DEPARTMENT_OTHER)
Admission: RE | Admit: 2011-02-13 | Discharge: 2011-02-13 | Disposition: A | Discharge status: Home / Self Care | Payer: Medicare Other | Source: Ambulatory Visit | Attending: Family Medicine | Admitting: Family Medicine

## 2011-02-13 DIAGNOSIS — J942 Hemothorax: Secondary | ICD-10-CM

## 2011-02-13 DIAGNOSIS — J90 Pleural effusion, not elsewhere classified: Secondary | ICD-10-CM | POA: Insufficient documentation

## 2011-02-13 DIAGNOSIS — R059 Cough, unspecified: Secondary | ICD-10-CM | POA: Insufficient documentation

## 2011-02-13 DIAGNOSIS — R0602 Shortness of breath: Secondary | ICD-10-CM

## 2011-02-13 DIAGNOSIS — R05 Cough: Secondary | ICD-10-CM | POA: Insufficient documentation

## 2011-02-13 NOTE — Telephone Encounter (Signed)
Was verbally advised by MD Beverely Low to call pt to advise the results of the recent x-ray is "no change noted" MD advised that she will send pt for a referral to [facility] for next week. If pt experiences any increase in shortness of breath he needs to go to ER. Spoke to pt wife and she understood the instructions and will take pt to ER if needed. Received a call from [facility] to state that Christian Scott needs labs however they do not have any orders.

## 2011-02-13 NOTE — Patient Instructions (Signed)
Go get the chest xray and we'll determine the next step Go now so we can get the results Hang in there!

## 2011-02-13 NOTE — Progress Notes (Signed)
  Subjective:    Patient ID: Christian Scott, male    DOB: 08/14/1920, 77 y.o.   MRN: [id number]  HPI Was admitted to the hospital w/ increasing pulmonary effusion, cough, SOB, and hemothorax.  Completed 7 days of avelox.  Does not have pulmonologist.  Reports he is still having difficulty breathing- wife feels his breathing is unchanged from when he was hospitalized.  There was talk in the hospital about draining the fluid but surgeon decided against this.   Review of Systems For ROS see HPI     Objective:   Physical Exam  Vitals reviewed. Constitutional: He appears well-developed and well-nourished. No distress.  Cardiovascular: Normal rate and normal heart sounds.        Irregular S1/S2  Pulmonary/Chest: Effort normal. No respiratory distress.       Coarse BS bilaterally w/ dullness and poor airmovement on L          Assessment & Plan:

## 2011-02-13 NOTE — Telephone Encounter (Signed)
Spoke with MD tabori, she advised that she did not send any labs for the pt on for a chest x ray. Called lab number given by rep darlene from [facility] and no answer noted/nor a vm pick up 2. Called back to speak to [facility] lab tech and was advised that pt and wife stated they needed labs to be drawn as well as chest x-ray. Pt was asked for any paperwork from MD but they only had paperwork about chest x-ray. This nurse advised tech the MD tabori only ordered chest x-ray unless his oncologist had ordered labs she did not. Tech advised she would discard the blood that she drew for PT/INR noted as a future lab results. I advised again that the oncologist may have sent the order but MD Tabori did not. Tech understood.

## 2011-02-17 ENCOUNTER — Telehealth: Payer: Self-pay | Admitting: *Deleted

## 2011-02-17 ENCOUNTER — Telehealth: Payer: Self-pay | Admitting: Pharmacist

## 2011-02-17 NOTE — Telephone Encounter (Signed)
Aware.  Pt has been taking this combo for years w/out difficulty

## 2011-02-17 NOTE — Telephone Encounter (Signed)
Maurine Minister from [facility] called and wanted to alert MD about level 1 medication intereaction,  He advised that the pharmacy and MD are both aware of this intereaction for the medication however per company policy they still need to alert the MD in charge. Medication intereaction noted for azilect and ultracet

## 2011-02-18 ENCOUNTER — Telehealth: Payer: Self-pay | Admitting: *Deleted

## 2011-02-18 NOTE — Telephone Encounter (Signed)
.  left message to have patient return my call.'for dennis from [facility] to call to note MD is aware and pt has been taking for years with no reactions noted.

## 2011-02-18 NOTE — Telephone Encounter (Signed)
Christian Scott from [facility] called back to clarify if it is ok for pt to take the 2 medications with a level 1 interaction noted. Left vm to clarify that the MD reveiwed medicaitons to state he has been taking for years with no intereaction noted with the ultracet and azilect. Left message if any questions he can call back

## 2011-02-22 NOTE — Assessment & Plan Note (Signed)
Pt has large, complex effusion.  Does not have pulmonologist.  Has oncologist.  Will get CXR to assess stability when compared to image at time of DC.  Will refer to pulm for ongoing tx.

## 2011-02-22 NOTE — Assessment & Plan Note (Signed)
Reviewed hospital records and imaging.  Pt's complex effusion/hemothorax was not drained while in hospital.  Pt continues to have SOB.  No longer on coumadin.  Will repeat CXR to see if effusions are stable or increasing in size.  Pt and wife are in agreement w/ plan.

## 2011-02-24 ENCOUNTER — Telehealth: Payer: Self-pay | Admitting: Family Medicine

## 2011-02-24 NOTE — Telephone Encounter (Signed)
FYI.Marland KitchenMarland KitchenMarland KitchenMarland KitchenIN REFERENCE TO PULMONOLOGY REFERRAL, THE APPOINTMENT WAS DENIED BY PATIENT & SPOUSE, THEY STATE HE IS DOING SO MUCH BETTER, AND IS HARDLY EVEN COUGHING.  PATIENT'S SPOUSE, BETTY, STATES SHE WILL CALL DR. TABORI IF ANY SIGNS THAT HE DOESN'T CONTINUE TO IMPROVE.

## 2011-02-24 NOTE — Telephone Encounter (Signed)
Noted  

## 2011-03-02 ENCOUNTER — Institutional Professional Consult (permissible substitution): Payer: Medicare Other | Admitting: Internal Medicine

## 2011-03-13 ENCOUNTER — Telehealth: Payer: Self-pay | Admitting: *Deleted

## 2011-03-13 NOTE — Telephone Encounter (Signed)
yes

## 2011-03-13 NOTE — Telephone Encounter (Signed)
Called to give verbal order to continue PT per MD Lowne "Yes" to pt have four more weeks of therapy, left vm on phone per Kizzie Fantasia the message to be left on vm, advised any questions to call the office

## 2011-03-13 NOTE — Telephone Encounter (Signed)
Patient PT Christian Scott from [facility] called and left vm stating that he has been in home with pt now for 4 weeks for PT and notes that pt needs 4 more weeks of PT care, can he have a verbal order to continue the therapy for 4 more weeks?

## 2011-04-01 ENCOUNTER — Other Ambulatory Visit: Payer: Self-pay | Admitting: Family Medicine

## 2011-04-01 MED ORDER — TRAMADOL-ACETAMINOPHEN 37.5-325 MG PO TABS: 2.0000 | ORAL_TABLET | Freq: Every day | ORAL | Status: DC

## 2011-04-01 NOTE — Telephone Encounter (Signed)
rx sent to pharmacy by e-script  

## 2011-04-01 NOTE — Telephone Encounter (Signed)
Last OV 02-13-11 last refill 01-07-11 #60 with 3 refills

## 2011-04-01 NOTE — Telephone Encounter (Signed)
Ok for #60, 3 refills 

## 2011-04-10 ENCOUNTER — Other Ambulatory Visit: Payer: Self-pay | Admitting: Dermatology

## 2011-04-14 ENCOUNTER — Telehealth: Payer: Self-pay | Admitting: Internal Medicine

## 2011-04-14 NOTE — Telephone Encounter (Signed)
Called pt, left message, lab and CT on 06/26/11 and md visit 06/29/11

## 2011-06-26 ENCOUNTER — Other Ambulatory Visit (HOSPITAL_BASED_OUTPATIENT_CLINIC_OR_DEPARTMENT_OTHER): Payer: Medicare Other | Admitting: Lab

## 2011-06-26 ENCOUNTER — Ambulatory Visit (HOSPITAL_COMMUNITY)
Admission: RE | Admit: 2011-06-26 | Discharge: 2011-06-26 | Disposition: A | Discharge status: Home / Self Care | Payer: Medicare Other | Source: Ambulatory Visit | Attending: Internal Medicine | Admitting: Internal Medicine

## 2011-06-26 DIAGNOSIS — I2699 Other pulmonary embolism without acute cor pulmonale: Secondary | ICD-10-CM

## 2011-06-26 DIAGNOSIS — Z923 Personal history of irradiation: Secondary | ICD-10-CM | POA: Insufficient documentation

## 2011-06-26 DIAGNOSIS — C3490 Malignant neoplasm of unspecified part of unspecified bronchus or lung: Secondary | ICD-10-CM | POA: Insufficient documentation

## 2011-06-26 DIAGNOSIS — Z9221 Personal history of antineoplastic chemotherapy: Secondary | ICD-10-CM | POA: Insufficient documentation

## 2011-06-26 DIAGNOSIS — I517 Cardiomegaly: Secondary | ICD-10-CM | POA: Insufficient documentation

## 2011-06-26 DIAGNOSIS — J9819 Other pulmonary collapse: Secondary | ICD-10-CM | POA: Insufficient documentation

## 2011-06-26 LAB — CMP (CANCER CENTER ONLY)
ALT(SGPT): 11 U/L (ref 10–47)
BUN, Bld: 18 mg/dL (ref 7–22)
CO2: 28 mEq/L (ref 18–33)
Calcium: 8.5 mg/dL (ref 8.0–10.3)
Chloride: 101 mEq/L (ref 98–108)
Creat: 1.1 mg/dl (ref 0.6–1.2)
Total Bilirubin: 0.6 mg/dl (ref 0.20–1.60)

## 2011-06-26 LAB — PROTIME-INR

## 2011-06-26 MED ORDER — IOHEXOL 300 MG/ML  SOLN
80.0000 mL | Freq: Once | INTRAMUSCULAR | Status: AC | PRN
  Administered 2011-06-26: 80 mL via INTRAVENOUS

## 2011-06-29 ENCOUNTER — Telehealth: Payer: Self-pay | Admitting: Internal Medicine

## 2011-06-29 ENCOUNTER — Ambulatory Visit (HOSPITAL_BASED_OUTPATIENT_CLINIC_OR_DEPARTMENT_OTHER): Payer: Medicare Other | Admitting: Internal Medicine

## 2011-06-29 VITALS — BP 150/69 | HR 53 | Temp 98.0°F | Ht 67.0 in | Wt 162.7 lb

## 2011-06-29 DIAGNOSIS — Z85118 Personal history of other malignant neoplasm of bronchus and lung: Secondary | ICD-10-CM

## 2011-06-29 DIAGNOSIS — J90 Pleural effusion, not elsewhere classified: Secondary | ICD-10-CM

## 2011-06-29 DIAGNOSIS — C3490 Malignant neoplasm of unspecified part of unspecified bronchus or lung: Secondary | ICD-10-CM

## 2011-06-29 NOTE — Telephone Encounter (Signed)
appts made and printed for pt aom °

## 2011-06-29 NOTE — Progress Notes (Signed)
[facility] [phone]   [phone]  OFFICE PROGRESS NOTE  Neena Rhymes, MD, MD [phone] [place] [address]  PRINCIPAL DIAGNOSIS:  Stage IV non-small cell lung cancer diagnosed in March of 2006.  PRIOR THERAPY:   1. Status post palliative radiotherapy to the left pleural-based metastatic lesion under the care of Dr. Dorna Bloom, last fraction was given Jul 22, 2004. 2. Status post 6 cycles of systemic chemotherapy with carboplatin and paclitaxel, last dose was given October 28, 2004, with partial response. 3. Status post 15 months treatment with oral Tarceva 150 mg p.o. daily, discontinued in May 2008, after the patient had disease stabilization and  requested break from the treatment.  CURRENT THERAPY:  Observation.  INTERVAL HISTORY: Christian Scott 78 y.o. male returns to the clinic today for six-month followup visit accompanied by his wife. Christian Scott has no complaints today except for generalized weakness and fatigue. He denied having any significant chest pain or shortness of breath. He continues to have mild cough. No significant weight loss. The patient has a repeat CT scan of the chest performed recently and he is here today for evaluation and discussion of his scan results.  MEDICAL HISTORY: Past Medical History  Diagnosis Date  . Parkinson's disease   . Macular degeneration, left eye     dry  . Pulmonary embolism 02/04/11    recent h/o  . Pneumonia 02/04/11    "here cause he may have pneumonia; never had it before"  . Stroke 1990's    "brought on his Parkinson's; drags right foot"  . Confusion 02/04/11    "he's getting worse"  . Pleural effusion 02/04/11    "today"  . Lung cancer 2006     S/P chemo & radiation  . Cancer     skin    ALLERGIES:  is allergic to morphine and related.  MEDICATIONS:  Current Outpatient Prescriptions  Medication Sig Dispense Refill  . Carbidopa (LODOSYN) 25 MG tablet Take 25 mg by  mouth 3 (three) times daily.        . carbidopa-levodopa-entacapone (STALEVO) 25-100-200 MG per tablet Take 1 tablet by mouth 3 (three) times daily.        . Multiple Vitamin (MULTIVITAMIN) tablet Take 1 tablet by mouth daily.        . rasagiline (AZILECT) 0.5 MG TABS Take 1 mg by mouth daily.        . traMADol-acetaminophen (ULTRACET) 37.5-325 MG per tablet Take 2 tablets by mouth at bedtime.  60 tablet  3    SURGICAL HISTORY:  Past Surgical History  Procedure Date  . Scalp surgery ~ 2008    S/P skin cancer excision  . Hip surgery ~ 2008    left; replacement  . Knee surgery     right; replacement  . Joint replacement   . Refractive surgery 1990's    bilaterally    REVIEW OF SYSTEMS:  A comprehensive review of systems was negative except for: Constitutional: positive for fatigue Respiratory: positive for cough   PHYSICAL EXAMINATION: General appearance: alert, cooperative, fatigued and no distress Neck: no adenopathy Lymph nodes: Cervical, supraclavicular, and axillary nodes normal. Resp: clear to auscultation bilaterally Cardio: regular rate and rhythm, S1, S2 normal, no murmur, click, rub or gallop GI: soft, non-tender; bowel sounds normal; no masses,  no organomegaly Extremities: extremities normal, atraumatic, no cyanosis or edema  ECOG PERFORMANCE STATUS: 1 - Symptomatic but completely ambulatory  Blood pressure  150/69, pulse 53, temperature 98 F (36.7 C), temperature source Oral, height 5\' 7"  (1.702 m), weight 162 lb 11.2 oz (73.8 kg).  LABORATORY DATA: Lab Results  Component Value Date   WBC 5.1 02/07/2011   HGB 11.9* 02/07/2011   HCT 34.7* 02/07/2011   MCV 96.9 02/07/2011   PLT 147* 02/07/2011      Chemistry      Component Value Date/Time   NA 142 06/26/2011 0846   NA 138 02/06/2011 0527   K 4.3 06/26/2011 0846   K 4.1 02/06/2011 0527   CL 101 06/26/2011 0846   CL 103 02/06/2011 0527   CO2 28 06/26/2011 0846   CO2 26 02/06/2011 0527   BUN 18 06/26/2011 0846    BUN 17 02/06/2011 0527   CREATININE 1.1 06/26/2011 0846   CREATININE 0.89 02/06/2011 0527      Component Value Date/Time   CALCIUM 8.5 06/26/2011 0846   CALCIUM 8.6 02/06/2011 0527   ALKPHOS 57 06/26/2011 0846   ALKPHOS 70 02/04/2011 2015   AST 28 06/26/2011 0846   AST 22 02/04/2011 2015   ALT 10 02/04/2011 2015   BILITOT 0.60 06/26/2011 0846   BILITOT 0.4 02/04/2011 2015       RADIOGRAPHIC STUDIES: Ct Chest W Contrast  06/26/2011  *RADIOLOGY REPORT*  Clinical Data: Follow-up lung carcinoma.  Previous chemotherapy and radiation therapy.  CT CHEST WITH CONTRAST  Technique:  Multidetector CT imaging of the chest was performed following the standard protocol during bolus administration of intravenous contrast.  Contrast: 80mL OMNIPAQUE IOHEXOL 300 MG/ML  SOLN  Comparison: 02/05/2011  Findings: Decreased left pleural effusion demonstrates since previous study with residual small left pleural effusion versus pleural thickening.  Rounded atelectasis in left lung base appears stable.  Mild right posterior pleural thickening and parenchymal scarring is also stable.  No new or enlarging pulmonary nodules or masses are identified.  There is no evidence of acute infiltrate.  Left pulmonary artery wall thickening is stable compared to previous contrast enhanced exams and is consistent with chronic pulmonary embolism.  No acute emboli identified on this exam.  Mild cardiomegaly stable.  No evidence of mediastinal or hilar adenopathy.  No adenopathy seen elsewhere within the thorax.  Both adrenal glands are normal in appearance.  No suspicious bone lesions identified.  Old L1 vertebral body compression fracture again demonstrated.  IMPRESSION:  1.  Decreased left pleural effusion.  No acute findings. 2.  No evidence of recurrent or metastatic carcinoma within the thorax.  Original Report Authenticated By: Danae Orleans, M.D.    ASSESSMENT: This is a very pleasant 78 years old white male with stage IV non-small  cell lung cancer diagnosed in March of 2006 status post palliative radiotherapy as well as systemic chemotherapy and he is currently on observation since May of 2008. The patient has no evidence for disease progression on his recent scan, but he continues to have left pleural effusion with no symptoms.  PLAN: I discussed the scan results with the patient and his wife. I recommended for him continuous observation with repeat CT scan of the chest and 6 months. He was advised to call me immediately she has any concerning symptoms in the interval in specifically any significant chest pain or shortness of breath.  All questions were answered. The patient knows to call the clinic with any problems, questions or concerns. We can certainly see the patient much sooner if necessary.

## 2011-07-01 ENCOUNTER — Other Ambulatory Visit: Payer: Self-pay | Admitting: Family Medicine

## 2011-07-01 MED ORDER — TRAMADOL-ACETAMINOPHEN 37.5-325 MG PO TABS: 2.0000 | ORAL_TABLET | Freq: Every day | ORAL | Status: DC

## 2011-07-01 NOTE — Telephone Encounter (Signed)
Last OV 02-13-11 last refill 04-01-11 #60 with 3 refills

## 2011-07-01 NOTE — Telephone Encounter (Signed)
Ok for #60, 3 refills 

## 2011-07-01 NOTE — Telephone Encounter (Signed)
refill Tramadol-Acetaminophn 37.5-325 Qty 60 Take 2-tablets by mouth at bedtime Last filled 3.26.13  Last OV 12.7.12

## 2011-07-01 NOTE — Telephone Encounter (Signed)
rx sent to pharmacy by e-script  

## 2011-08-21 ENCOUNTER — Other Ambulatory Visit: Payer: Self-pay | Admitting: *Deleted

## 2011-08-21 MED ORDER — CARBIDOPA 25 MG PO TABS: 25.0000 mg | ORAL_TABLET | Freq: Three times a day (TID) | ORAL | Status: AC

## 2011-08-21 NOTE — Telephone Encounter (Signed)
Discuss with patient wife., Rx sent  

## 2011-08-21 NOTE — Telephone Encounter (Signed)
Ok for refill on Carbidopa, #90, no refills.  Needs to schedule an appt w/ Neuro to ensure he continues to get his medication

## 2011-08-21 NOTE — Telephone Encounter (Signed)
Pt wife indicated that Pt is leaving to go out of town and needs med refill. Pt wife states that they contacted Rx physician but he has not seen Pt in a while so is not willing to fill med and advise that they contact PCP to see if you were willing to fill med. .Please advise

## 2011-09-02 ENCOUNTER — Encounter: Payer: Self-pay | Admitting: Internal Medicine

## 2011-09-02 ENCOUNTER — Ambulatory Visit (INDEPENDENT_AMBULATORY_CARE_PROVIDER_SITE_OTHER): Payer: Medicare Other | Admitting: Internal Medicine

## 2011-09-02 VITALS — BP 130/80 | HR 63 | Temp 98.3°F | Wt 161.0 lb

## 2011-09-02 DIAGNOSIS — L02419 Cutaneous abscess of limb, unspecified: Secondary | ICD-10-CM

## 2011-09-02 DIAGNOSIS — L03116 Cellulitis of left lower limb: Secondary | ICD-10-CM

## 2011-09-02 DIAGNOSIS — L03119 Cellulitis of unspecified part of limb: Secondary | ICD-10-CM

## 2011-09-02 MED ORDER — AMOXICILLIN-POT CLAVULANATE 875-125 MG PO TABS: 1.0000 | ORAL_TABLET | Freq: Two times a day (BID) | ORAL | Status: AC

## 2011-09-02 MED ORDER — MUPIROCIN 2 % EX OINT: TOPICAL_OINTMENT | CUTANEOUS | Status: DC

## 2011-09-02 NOTE — Patient Instructions (Addendum)
To ER if  Warning Signs appear or progress: red streaks up leg, pus, fever, increasing pain.   Dip gauze in  sterile saline and applied to the wound twice a day. Cover the wound with Telfa , non stick dressing  without any antibiotic ointment. The saline can be purchased at the drugstore or you can make your own .Boil cup of salt in a gallon of water. Store mixture  in a clean container.

## 2011-09-02 NOTE — Progress Notes (Signed)
  Subjective:    Patient ID: Christian Scott, male    DOB: May 16, 1920, 78 y.o.   MRN: [id number]  HPI He was scratched by his dog approximately 1 week ago on the L thigh; his wife applied Neosporin that day to the lacerations. As of 6/24 there was some erythema in this area but as of today  it has become progressively more red& swollen . This morning she again  applied Neosporin to this area   Review of Systems He denies fever, chills, or sweats. Although he is a nonsmoker; he has been treated with chemotherapy radiation for lung cancer. He is followed by Dr. Shirline Frees. His last CT was 06/26/11. This revealed decreased left pleural effusion with no evidence of recurrent disease. 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  Dr. 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 10 days ago he completed a ten-day course of oral antibiotics. He has to take laxatives as needed; his wife states he has not had any diarrhea.     Objective:   Physical Exam  She is the primary historian; he is passive and nonverbal. His wife states that this is related to profound hearing loss  There is a large irregular 4 x 4 centimeter lesion with eschar over the top of the skull.  He has no lymphadenopathy about the neck or axilla  No scleral icterus or jaundice is noted.  Rest sounds are decreased: There is no increased work of breathing.  He has a slow, slightly irregular heart rhythm with some respiratory variation  There is an area of 9 x 12 cm cellulitis of the left thigh with with an irregular 3 x 2 cm laceration. 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 Saline wet-to-dry dressings to the left thigh. Bactroban to the  open areas. To emergency room if he is having fever, increasing pain or swelling in the thigh, or extension of the erythema proximally

## 2011-09-07 ENCOUNTER — Other Ambulatory Visit: Payer: Self-pay | Admitting: Internal Medicine

## 2011-09-21 ENCOUNTER — Other Ambulatory Visit: Payer: Self-pay | Admitting: Dermatology

## 2011-10-06 ENCOUNTER — Telehealth: Payer: Self-pay | Admitting: Family Medicine

## 2011-10-06 NOTE — Telephone Encounter (Signed)
aller: Betty/Spouse; PCP: Sheliah Hatch.; CB#: [phone]; ; ; Call regarding General Fatigue and Weakness With Extreme Exhaustion THE PATIENT REFUSED 911;  Spouse/Betty is calling to say pt had a problem getting up this am. He is lethargic. He is unable to bath himself this am or hold himself upright. RN advised 911 due to profound lethargy.

## 2011-10-06 NOTE — Telephone Encounter (Signed)
ABSOLUTELY NEEDS ER EVALUATION!

## 2011-10-06 NOTE — Telephone Encounter (Signed)
Spoke to pt wife to advise instructions per MD Beverely Low as Follows: ABSOLUTELY NEEDS ER EVALUATION!  Pt wife noted that he is actually better, advised he now can hold himself upright and that he is NO longer lethargic, pt notes feeling much better now and has eaten a full break fest, per MD Tabori verbal instructions to advise pt if his sxs return to GO TO THE ER, pt advised that she will take pt to the ER if the sxs return.

## 2011-11-02 ENCOUNTER — Telehealth: Payer: Self-pay | Admitting: *Deleted

## 2011-11-02 ENCOUNTER — Other Ambulatory Visit: Payer: Self-pay | Admitting: Family Medicine

## 2011-11-02 NOTE — Telephone Encounter (Signed)
Last OV 02-13-11 (OV with acute/Hopper 09-02-11) last refill 07-01-11 #60 with 3 refills

## 2011-11-02 NOTE — Telephone Encounter (Signed)
i don't have recent notes from [facility].  Pt is seeing Dr Gwenyth Bouillon (oncology).  It would be appropriate for him to refer.  If he is not able to, will likely need to see pt prior to referring

## 2011-11-02 NOTE — Telephone Encounter (Signed)
Pt wife states that Pt was recently Rx with cancer and is interesting in getting Hospice involved. Pt wife would like to know if Pt would need to be seen or if Dr Beverely Low can use info from [facility] in reference to Pt recent Dx.Please advise

## 2011-11-02 NOTE — Telephone Encounter (Signed)
Noted MD Tabori faxed RX to pharmacy for #60 with 3 refills via escribe

## 2011-11-03 NOTE — Telephone Encounter (Signed)
Spoke to pt wife to advise the need for cancer MD Perlie Gold to send for hospice per notes pt current MD is Perlie Gold per now pt has cancer in head area and MD Gwenyth Bouillon no longer treats per was for cancer in lung that has been completed for treatment, was advised by MD Perlie Gold to have PCP send for hospice, advised MD Beverely Low will have to see in office within 6months for new DX based on new protocol to see within 6 months for referral, pt wife understood and advised to call her back when the notes from MD Perlie Gold are received thus she will then schedule pt to come in and see MD Beverely Low, advised verbally to MD Beverely Low, will await notes

## 2011-11-16 ENCOUNTER — Telehealth: Payer: Self-pay | Admitting: Family Medicine

## 2011-11-16 NOTE — Telephone Encounter (Signed)
Callback: [phone]  Caller: Toniann Fail with [facility] needs MD to complete DNR form or give verbal order for Hospice MD to complete form per family request. Toniann Fail advised the on call MD did not need to be called after hours regarding this.

## 2011-11-17 NOTE — Telephone Encounter (Signed)
Yes, ok for DNR- will give verbal or sign form (whichever they prefer)

## 2011-11-17 NOTE — Telephone Encounter (Signed)
Called wendy with hospice and pallative care to advise, noted vm, left message to call office, called pt family that we are trying to get in touch with hospice to advise the DNR is available per MD Beverely Low however will await call from hospice

## 2011-11-23 NOTE — Telephone Encounter (Signed)
Called Christian Scott and advised verbal DNR per MD Beverely Low, advised she will note the pt chart with hospice, contact the family and send a form for MD Tabori to sign as well, alerted MD Tabori verbally, will await form

## 2011-11-24 ENCOUNTER — Telehealth: Payer: Self-pay | Admitting: Radiation Oncology

## 2011-11-24 DIAGNOSIS — R5383 Other fatigue: Secondary | ICD-10-CM

## 2011-11-24 DIAGNOSIS — C7952 Secondary malignant neoplasm of bone marrow: Secondary | ICD-10-CM

## 2011-11-24 DIAGNOSIS — R5381 Other malaise: Secondary | ICD-10-CM

## 2011-11-24 DIAGNOSIS — C4440 Unspecified malignant neoplasm of skin of scalp and neck: Secondary | ICD-10-CM

## 2011-11-24 DIAGNOSIS — C7951 Secondary malignant neoplasm of bone: Secondary | ICD-10-CM

## 2011-11-24 NOTE — Telephone Encounter (Signed)
Phone home number listed in demographics and spoke with patient's wife. Reminded her of tomorrow's appointment and she confirmed they would be present. Also, she reports that a recent biopsy was done by Dr. Donzetta Starch of [facility]. Will make another attempt in the morning to obtain recent pathology report. Routed message to Dr. Michell Heinrich.

## 2011-11-25 ENCOUNTER — Encounter: Payer: Self-pay | Admitting: Radiation Oncology

## 2011-11-25 ENCOUNTER — Ambulatory Visit
Admission: RE | Admit: 2011-11-25 | Discharge: 2011-11-25 | Disposition: A | Discharge status: Home / Self Care | Source: Ambulatory Visit | Attending: Radiation Oncology | Admitting: Radiation Oncology

## 2011-11-25 VITALS — BP 129/55 | HR 52 | Temp 97.1°F | Resp 18 | Ht 69.0 in | Wt 156.0 lb

## 2011-11-25 DIAGNOSIS — C4490 Unspecified malignant neoplasm of skin, unspecified: Secondary | ICD-10-CM

## 2011-11-25 NOTE — Progress Notes (Signed)
See progress noted under physician encounter.  

## 2011-11-25 NOTE — Progress Notes (Signed)
Patient presents to the clinic today accompanied by his wife for a consultation with Dr. Michell Heinrich to discuss the role of radiation therapy in the treatment of a scalp lesion. Patient is alert and oriented to person, place, and time. No distress noted. Patient sitting in wheelchair. Flat affect noted. Patient denies pain even at the site of the scalp lesion. Patient denies nausea, vomiting, headache, dizziness or diarrhea. Patient denies diplopia or floaters. Patient very HOH. Patient's wife reports that he is taking an antibiotic and sleeping medication but, she is unsure of the names. She goes on to report that the only other medications he takes is for his parkinsons. Patient's wife reports that they have been under the care of hospice for two weeks per Lendell Caprice suggestion. Also, patient scheduled for CT of the body 10/21 and follow up with [facility] 10/23 reference lung ca. Golf ball size lesion noted center top of scalp without drainage. Patient's wife report that the drainage from the scalp lesion at night has greatly improved since he began the antibiotics but, the size of the lesion has remained the same. Reported all findings to Dr. Michell Heinrich.

## 2011-11-25 NOTE — Progress Notes (Signed)
Radiation Oncology         [phone] ________________________________  Initial outpatient Consultation  Name: Christian Scott MRN: [id number]  Date: 11/25/2011  DOB: 1920-05-28  REFERRING PHYSICIAN: Wendall Mola, *  DIAGNOSIS: The encounter diagnosis was Skin cancer.  HISTORY OF PRESENT ILLNESS::Lenzy Lacretia Nicks Scott is a 78 y.o. male  who had a lesion on his scalp removed in November of 2004. This was an atypical fibroxanthoma. In 2008 he underwent Mohs excision for squamous cell carcinoma. Per his wife this wound never really healed. A recurrence was noted in 2010 and he underwent 60 Gy of radiation which he completed July 2010. He also has a history of stage IV non-small cell lung cancer diagnosed in March of 2006 and treated with palliative radiation to the left chest in May of 2006. He has been followed by Dr. Arbutus Ped and has been off therapy since 2008. Since the radiation in 2010 that area has never completely healed per the patient's wife. They have had multiple biopsies I. Dr. Yetta Barre his terminology this most recently in July of this year which showed ulceration with inflammation and granulation tissue. No evidence of malignancy was found. Per his wife his lesion had been draining and leaving his spell low wet at night. He was placed on antibiotics by his dermatologist and the drainage improved. He was then referred to Dr. Lendell Caprice at [facility] for possible surgical resection options. Imaging there included an MRI of the brain on August 20 which showed a 5.3 x 3.5 x 4.9 cm mass in the parietal scalp eroding into the calvarium and contacting the dura. The dura was noted to be thickened and enhancing in this area. The superior sagittal sinus was displaced without evidence of invasion. No frank brain invasion was noted. After consultation with neurosurgery Dr. Lendell Caprice did not feel that he would survive a large surgical resection and it would require flap reconstruction to close the  surgical defect. He was therefore referred to me for consideration of radiation in the palliative management of this disease.   Per his wife since being placed back on antibiotics which he has been on for 4 days the drainage has improved. Both the patient and his wife deny any pain or headaches associated with this. The patient states that he does not like having a bump like this on the back of his head but other than that has no symptoms related to this. He does have some visual issues mostly related to advanced macular degeneration. He also has weakness which bothers him which is residual from a stroke. Other than that his wife says he is healthy. He does spend most of his time in a chair. At Dr. Pincus Sanes request they have called hospice and enrolled in their services.  PREVIOUS RADIATION THERAPY: Yes 35 Gray to the left pleural metastases finished May 2006. 60 Wallace Cullens to the scalp completed July 2010  PAST MEDICAL HISTORY:  has a past medical history of Parkinson's disease; Macular degeneration, left eye; Pulmonary embolism (02/04/11); Pneumonia (02/04/11); Stroke (1990's); Confusion (02/04/11); Pleural effusion (02/04/11); Lung cancer (2006); Cancer; Arthritis; Depression; and S/P radiation therapy.    PAST SURGICAL HISTORY: Past Surgical History  Procedure Date  . Scalp surgery 2004, 2008    S/P skin cancer excision using Mohs technique  . Hip surgery ~ 2008    left; replacement  . Knee surgery     right; replacement  . Joint replacement   . Refractive surgery 1990's    bilaterally  FAMILY HISTORY: family history includes Cancer in his maternal aunt; Diabetes type II in his mother; and Stroke in his mother.  SOCIAL HISTORY:  reports that he has never smoked. He has never used smokeless tobacco. He reports that he drinks about 2.4 ounces of alcohol per week. He reports that he does not use illicit drugs.  ALLERGIES: Morphine and related  MEDICATIONS:  Current Outpatient  Prescriptions  Medication Sig Dispense Refill  . Carbidopa (LODOSYN) 25 MG tablet Take 1 tablet by mouth 3 (three) times daily.  90 tablet  0  . carbidopa-levodopa-entacapone (STALEVO) 25-100-200 MG per tablet Take 1 tablet by mouth 3 (three) times daily.        . rasagiline (AZILECT) 0.5 MG TABS Take 1 mg by mouth daily.        . Multiple Vitamin (MULTIVITAMIN) tablet Take 1 tablet by mouth daily.        . mupirocin ointment (BACTROBAN) 2 % APPLY TWICE A DAY TO THE AFFECTED AREA (NOT INTO EYES)  22 g  0  . traMADol-acetaminophen (ULTRACET) 37.5-325 MG per tablet TAKE 2 TABLETS BY MOUTH AT BEDTIME.  60 tablet  3    REVIEW OF SYSTEMS:  A 15 point review of systems is documented in the electronic medical record. This was obtained by the nursing staff. However, I reviewed this with the patient to discuss relevant findings and make appropriate changes.  Pertinent items are noted in HPI.   PHYSICAL EXAM:  height is 5\' 9"  (1.753 m) and weight is 156 lb (70.761 kg). His oral temperature is 97.1 F (36.2 C). His blood pressure is 129/55 and his pulse is 52. His respiration is 18.   Marland Kitchen He is a pleasant elderly male who is hard of hearing sitting in a wheelchair. He has left facial weakness and some dysarthria. He wears glasses 2 to photosensitivity. In the high mid-vertex of the skull there is a 4 cm exophytic lesion which protrudes from the scalp and is about 4 cm tall. It is not painful to palpation. It is not bleeding or oozing. It is consistent with disease recurrence.  LABORATORY DATA:  Lab Results  Component Value Date   WBC 5.1 02/07/2011   HGB 11.9* 02/07/2011   HCT 34.7* 02/07/2011   MCV 96.9 02/07/2011   PLT 147* 02/07/2011   Lab Results  Component Value Date   NA 142 06/26/2011   K 4.3 06/26/2011   CL 101 06/26/2011   CO2 28 06/26/2011   Lab Results  Component Value Date   ALT 10 02/04/2011   AST 28 06/26/2011   ALKPHOS 57 06/26/2011   BILITOT 0.60 06/26/2011     RADIOGRAPHY: No results  found.    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  Clearly he has a clinical recurrence despite a negative biopsy. He is relatively asymptomatic at this time and has enrolled in hospice. Given the previous radiation to this area, I do not believe I would be able to give him a definitive radiation dose for cure. At the most I can probably give him another 30 Gy. This would be purely palliative in nature and I worry that when the tumor cells died off, he would be left with an open, nonhealing, and draining wound and possible entryway into the CSF space which would definitely be worse than what they're dealing with now. His wife is also not  excited about pursuing radiation as she feels he never really healed from his initial course of radiation in the first place. She would prefer a more palliative approach and we discussed that if his drainage was well controlled now on antibiotics that there really was not much at this time to palliate. I have given her my card and asked her to contact me if he experiences worsening headaches, bleeding or drainage. We could then proceed forward with palliative superficial radiation at that time. They both seem satisfied with this noninvasive approach at this time.  I spent 30 minutes  face to face with the patient and more than 50% of that time was spent in counseling and/or coordination of care.   ------------------------------------------------  Lurline Hare, MD

## 2011-11-25 NOTE — Progress Notes (Signed)
78 year old male. Married.   Consult today to discuss the role of radiation therapy in the treatment of top of scalp lesion.  AX: morphine HX of XRT: 08/20/08-10/01/08 superior scalp 6000 cGy in 30 fractions by Jodi Geralds, MD and 07/03/2004-07/25/2004 left pleural met 3500 cGy in 10 fractions by Dr. Jackelyn Knife No indication of a pacemaker

## 2011-11-25 NOTE — Progress Notes (Signed)
Complete PATIENT MEASURE OF DISTRESS worksheet with a score of 6 submitted to social work. 

## 2011-11-26 NOTE — Telephone Encounter (Signed)
Noted MD Beverely Low completed DNR form, was given to Elise Benne to process and note in chart, family aware

## 2011-12-07 NOTE — Addendum Note (Signed)
Encounter addended by: Christi Wirick Mintz Bayron Dalto, RN on: 12/07/2011  7:41 PM<BR>     Documentation filed: Charges VN

## 2011-12-28 ENCOUNTER — Other Ambulatory Visit: Payer: Medicare Other

## 2011-12-28 ENCOUNTER — Ambulatory Visit (HOSPITAL_COMMUNITY): Payer: Medicare Other

## 2011-12-30 ENCOUNTER — Ambulatory Visit: Payer: Medicare Other | Admitting: Internal Medicine

## 2012-02-19 DIAGNOSIS — R443 Hallucinations, unspecified: Secondary | ICD-10-CM

## 2012-02-19 DIAGNOSIS — C4449 Other specified malignant neoplasm of skin of scalp and neck: Secondary | ICD-10-CM

## 2012-02-19 DIAGNOSIS — R269 Unspecified abnormalities of gait and mobility: Secondary | ICD-10-CM

## 2012-02-19 DIAGNOSIS — C7951 Secondary malignant neoplasm of bone: Secondary | ICD-10-CM

## 2012-02-19 DIAGNOSIS — C7952 Secondary malignant neoplasm of bone marrow: Secondary | ICD-10-CM

## 2012-02-19 DIAGNOSIS — R5381 Other malaise: Secondary | ICD-10-CM

## 2012-02-19 DIAGNOSIS — R5383 Other fatigue: Secondary | ICD-10-CM

## 2012-02-26 ENCOUNTER — Telehealth: Payer: Self-pay | Admitting: Family Medicine

## 2012-02-26 NOTE — Telephone Encounter (Signed)
Refill: tramadol-acetaminophen 37.5-325. Take 2 tablets by mouth at bedtime. Qty 60. Last fill 02-01-12

## 2012-02-26 NOTE — Telephone Encounter (Signed)
Please advise on RF request.//AB/CMA 

## 2012-02-28 NOTE — Telephone Encounter (Signed)
Ok for #60, 1 refill 

## 2012-02-29 MED ORDER — TRAMADOL-ACETAMINOPHEN 37.5-325 MG PO TABS: 2.0000 | ORAL_TABLET | Freq: Every day | ORAL | Status: AC

## 2012-02-29 NOTE — Telephone Encounter (Signed)
Rx was faxed to pharmacy//[email or web address]

## 2013-10-16 NOTE — Telephone Encounter (Signed)
Phone call - encounter closed. 

## 2013-12-30 ENCOUNTER — Other Ambulatory Visit: Payer: Self-pay | Admitting: Nurse Practitioner
